# Patient Record
Sex: Female | Born: 1940 | Race: White | Hispanic: No | Marital: Married | Smoking: Former smoker
Health system: Southern US, Community
[De-identification: ages and names within clinical notes are randomized; demographics above are authoritative.]

## PROBLEM LIST (undated history)

## (undated) DIAGNOSIS — J9383 Other pneumothorax: Secondary | ICD-10-CM

## (undated) DIAGNOSIS — I10 Essential (primary) hypertension: Secondary | ICD-10-CM

## (undated) DIAGNOSIS — J449 Chronic obstructive pulmonary disease, unspecified: Secondary | ICD-10-CM

## (undated) DIAGNOSIS — I252 Old myocardial infarction: Secondary | ICD-10-CM

## (undated) HISTORY — PX: CHEST TUBE INSERTION: SHX231

---

## 2004-11-06 HISTORY — PX: OTHER SURGICAL HISTORY: SHX169

## 2016-08-10 ENCOUNTER — Encounter: Payer: Self-pay | Admitting: Emergency Medicine

## 2016-08-10 ENCOUNTER — Inpatient Hospital Stay
Admission: EM | Admit: 2016-08-10 | Discharge: 2016-08-16 | DRG: 201 | Disposition: A | Discharge status: Home / Self Care | Payer: Medicare PPO | Attending: Cardiothoracic Surgery | Admitting: Cardiothoracic Surgery

## 2016-08-10 ENCOUNTER — Emergency Department: Payer: Medicare PPO

## 2016-08-10 DIAGNOSIS — I10 Essential (primary) hypertension: Secondary | ICD-10-CM | POA: Diagnosis present

## 2016-08-10 DIAGNOSIS — I2510 Atherosclerotic heart disease of native coronary artery without angina pectoris: Secondary | ICD-10-CM | POA: Diagnosis present

## 2016-08-10 DIAGNOSIS — J9311 Primary spontaneous pneumothorax: Secondary | ICD-10-CM

## 2016-08-10 DIAGNOSIS — J9383 Other pneumothorax: Secondary | ICD-10-CM | POA: Diagnosis present

## 2016-08-10 DIAGNOSIS — I252 Old myocardial infarction: Secondary | ICD-10-CM | POA: Diagnosis not present

## 2016-08-10 DIAGNOSIS — Z09 Encounter for follow-up examination after completed treatment for conditions other than malignant neoplasm: Secondary | ICD-10-CM

## 2016-08-10 DIAGNOSIS — Z7982 Long term (current) use of aspirin: Secondary | ICD-10-CM | POA: Diagnosis not present

## 2016-08-10 DIAGNOSIS — Z79899 Other long term (current) drug therapy: Secondary | ICD-10-CM

## 2016-08-10 DIAGNOSIS — Z87891 Personal history of nicotine dependence: Secondary | ICD-10-CM

## 2016-08-10 DIAGNOSIS — Z9889 Other specified postprocedural states: Secondary | ICD-10-CM

## 2016-08-10 DIAGNOSIS — R0602 Shortness of breath: Secondary | ICD-10-CM | POA: Diagnosis present

## 2016-08-10 DIAGNOSIS — J939 Pneumothorax, unspecified: Secondary | ICD-10-CM | POA: Diagnosis present

## 2016-08-10 HISTORY — DX: Other pneumothorax: J93.83

## 2016-08-10 HISTORY — DX: Old myocardial infarction: I25.2

## 2016-08-10 LAB — COMPREHENSIVE METABOLIC PANEL
ALK PHOS: 53 U/L (ref 38–126)
ALT: 15 U/L (ref 14–54)
ANION GAP: 6 (ref 5–15)
AST: 27 U/L (ref 15–41)
Albumin: 3.9 g/dL (ref 3.5–5.0)
BILIRUBIN TOTAL: 0.7 mg/dL (ref 0.3–1.2)
BUN: 16 mg/dL (ref 6–20)
CALCIUM: 9.5 mg/dL (ref 8.9–10.3)
CO2: 32 mmol/L (ref 22–32)
Chloride: 101 mmol/L (ref 101–111)
Creatinine, Ser: 0.71 mg/dL (ref 0.44–1.00)
GFR calc non Af Amer: >60 mL/min (ref >60)
Glucose, Bld: 101 mg/dL — ABNORMAL HIGH (ref 65–99)
Potassium: 3.8 mmol/L (ref 3.5–5.1)
SODIUM: 139 mmol/L (ref 135–145)
TOTAL PROTEIN: 6.9 g/dL (ref 6.5–8.1)

## 2016-08-10 LAB — CBC WITH DIFFERENTIAL/PLATELET
Basophils Absolute: 0 10*3/uL (ref 0–0.1)
Basophils Relative: 0 %
EOS ABS: 0.1 10*3/uL (ref 0–0.7)
Eosinophils Relative: 1 %
HEMATOCRIT: 39.9 % (ref 35.0–47.0)
HEMOGLOBIN: 13.4 g/dL (ref 12.0–16.0)
LYMPHS ABS: 0.8 10*3/uL — AB (ref 1.0–3.6)
Lymphocytes Relative: 17 %
MCH: 32.1 pg (ref 26.0–34.0)
MCHC: 33.6 g/dL (ref 32.0–36.0)
MCV: 95.6 fL (ref 80.0–100.0)
MONO ABS: 0.3 10*3/uL (ref 0.2–0.9)
MONOS PCT: 7 %
NEUTROS PCT: 75 %
Neutro Abs: 3.6 10*3/uL (ref 1.4–6.5)
Platelets: 195 10*3/uL (ref 150–440)
RBC: 4.17 MIL/uL (ref 3.80–5.20)
RDW: 13.1 % (ref 11.5–14.5)
WBC: 4.9 10*3/uL (ref 3.6–11.0)

## 2016-08-10 LAB — APTT: APTT: 36 s (ref 24–36)

## 2016-08-10 LAB — PROTIME-INR
INR: 1
Prothrombin Time: 13.2 seconds (ref 11.4–15.2)

## 2016-08-10 MED ORDER — LISINOPRIL 20 MG PO TABS
40.0000 mg | ORAL_TABLET | Freq: Every day | ORAL | Status: DC
  Administered 2016-08-11 – 2016-08-16 (×6): 40 mg via ORAL
  Filled 2016-08-10 (×6): qty 2

## 2016-08-10 MED ORDER — METOPROLOL TARTRATE 50 MG PO TABS
100.0000 mg | ORAL_TABLET | Freq: Every day | ORAL | Status: DC
  Administered 2016-08-11 – 2016-08-16 (×5): 100 mg via ORAL
  Filled 2016-08-10 (×5): qty 2

## 2016-08-10 MED ORDER — DEXTROSE-NACL 5-0.45 % IV SOLN
INTRAVENOUS | Status: DC
  Administered 2016-08-10: 18:00:00 via INTRAVENOUS

## 2016-08-10 MED ORDER — LISINOPRIL 20 MG PO TABS: 20.0000 mg | ORAL_TABLET | Freq: Every day | ORAL | Status: DC

## 2016-08-10 MED ORDER — LIDOCAINE HCL (PF) 1 % IJ SOLN
INTRAMUSCULAR | Status: AC
  Filled 2016-08-10: qty 20

## 2016-08-10 MED ORDER — ATORVASTATIN CALCIUM 20 MG PO TABS
40.0000 mg | ORAL_TABLET | Freq: Every day | ORAL | Status: DC
  Administered 2016-08-11 – 2016-08-16 (×6): 40 mg via ORAL
  Filled 2016-08-10 (×6): qty 2

## 2016-08-10 MED ORDER — LISINOPRIL 20 MG PO TABS
ORAL_TABLET | ORAL | Status: AC
  Filled 2016-08-10: qty 1

## 2016-08-10 MED ORDER — HYDROCHLOROTHIAZIDE 12.5 MG PO CAPS
12.5000 mg | ORAL_CAPSULE | Freq: Every day | ORAL | Status: DC
  Administered 2016-08-11: 12.5 mg via ORAL
  Filled 2016-08-10: qty 1

## 2016-08-10 MED ORDER — ACETAMINOPHEN 325 MG PO TABS
650.0000 mg | ORAL_TABLET | ORAL | Status: DC | PRN
  Administered 2016-08-13 – 2016-08-15 (×5): 650 mg via ORAL
  Filled 2016-08-10 (×5): qty 2

## 2016-08-10 MED ORDER — HYDROCHLOROTHIAZIDE 12.5 MG PO CAPS
ORAL_CAPSULE | ORAL | Status: AC
  Filled 2016-08-10: qty 1

## 2016-08-10 MED ORDER — OXYCODONE-ACETAMINOPHEN 5-325 MG PO TABS
1.0000 | ORAL_TABLET | ORAL | Status: DC | PRN
  Administered 2016-08-14: 1 via ORAL
  Filled 2016-08-10: qty 1

## 2016-08-10 MED ORDER — LIDOCAINE HCL (PF) 1 % IJ SOLN
INTRAMUSCULAR | Status: AC
  Filled 2016-08-10: qty 10

## 2016-08-10 MED ORDER — ASPIRIN 81 MG PO CHEW
81.0000 mg | CHEWABLE_TABLET | Freq: Every day | ORAL | Status: DC
  Administered 2016-08-11 – 2016-08-16 (×6): 81 mg via ORAL
  Filled 2016-08-10 (×6): qty 1

## 2016-08-10 NOTE — Progress Notes (Signed)
Patient ID: Erica Cobb, female   DOB: Dec 08, 1940, 75 y.o.   MRN: [id number]  Chief Complaint  Patient presents with  . Shortness of Breath    HPI Erica Cobb is a 75 y.o. female.  She was vacationing at [facility] approximately 1 week ago when she experienced the acute onset of right-sided chest pain with shortness of breath. Over the course of several hours this progressed and she ultimately went to a urgent care center where chest x-ray showed a right-sided pneumothorax. A right-sided chest tube was placed and she was admitted to the hospital. Her chest tube remained in place for several days and was ultimately removed. She spent one additional evening in the hospital and a chest x-ray the next morning confirmed that the lung was fully inflated and she was discharged to home. That was approximately 5 days ago. Earlier today she experienced the acute onset of some right-sided chest pain with shortness of breath and felt very similar to the prior episode. She presented to our emergency department where a chest x-ray showed a large right-sided pneumothorax. I was asked to see the patient for evaluation and after review of her x-rays we placed a right-sided chest tube and will now admit the patient to the hospital for continued management. She states that she does feel better after the chest tube is been inserted. She denies any significant pain at the chest tube site. She is hungry. She is inspiring nasal cannula oxygen but does not feel short of breath at this time. She does have a prior history of smoking but quit 11 years ago. She also has a history of coronary artery disease for which she takes aspirin. She had several stents placed 11 years ago. She follows with Dr. Harold Hedge for management of her coronary disease and her hypertension.   Past Medical History:  Diagnosis Date  . 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 Spontaneous pneumothorax     Past Surgical History:  Procedure Laterality Date  . CHEST  TUBE INSERTION     for spontaneous pneumo    History reviewed. No pertinent family history.  Social History Social History  Substance Use Topics  . Smoking status: Former Games developer  . Smokeless tobacco: Never Used  . Alcohol use No     Comment: socially    No Known Allergies  Current Facility-Administered Medications  Medication Dose Route Frequency Provider Last Rate Last Dose  . acetaminophen (TYLENOL) tablet 650 mg  650 mg Oral Q4H PRN Hulda Marin, MD      . aspirin chewable tablet 81 mg  81 mg Oral Daily Hulda Marin, MD      . Melene Muller ON 08/11/2016] atorvastatin (LIPITOR) tablet 40 mg  40 mg Oral Q0600 Hulda Marin, MD      . dextrose 5 %-0.45 % sodium chloride infusion   Intravenous Continuous Hulda Marin, MD      . hydrochlorothiazide (MICROZIDE) capsule 12.5 mg  12.5 mg Oral Daily Hulda Marin, MD      . lidocaine (PF) (XYLOCAINE) 1 % injection           . lisinopril (PRINIVIL,ZESTRIL) tablet 20 mg  20 mg Oral Daily Hulda Marin, MD      . Melene Muller ON 08/11/2016] metoprolol (LOPRESSOR) tablet 100 mg  100 mg Oral Q0600 Hulda Marin, MD      . oxyCODONE-acetaminophen (PERCOCET/ROXICET) 5-325 MG per tablet 1-2 tablet  1-2 tablet Oral Q4H PRN Hulda Marin, MD  Current Outpatient Prescriptions  Medication Sig Dispense Refill  . aspirin EC 81 MG tablet Take 1 tablet by mouth daily.    Marland Kitchen. atorvastatin (LIPITOR) 40 MG tablet Take 1 tablet by mouth daily.    Marland Kitchen. lisinopril-hydrochlorothiazide (PRINZIDE,ZESTORETIC) 20-12.5 MG tablet Take 1 tablet by mouth daily.    . metoprolol succinate (TOPROL-XL) 100 MG 24 hr tablet Take 1 tablet by mouth daily.      Location, Quality, Duration, Severity, Timing, Context, Modifying Factors, Associated Signs and Symptoms.  Review of Systems A complete review of systems was asked and was negative except for the following positive findings shortness of breath and chest pain  Blood pressure (!) 154/109, pulse 94, temperature 97.6 F (36.4 C),  temperature source Oral, resp. rate (!) 28, height 5' (1.524 m), weight 93 lb (42.2 kg), SpO2 99 %.  Physical Exam CONSTITUTIONAL:  Pleasant, well-developed, well-nourished, and in no acute distress. EYES: Pupils equal and reactive to light, Sclera non-icteric EARS, NOSE, MOUTH AND THROAT:  The oropharynx was clear.  Dentition is good repair.  Oral mucosa pink and moist. LYMPH NODES:  Lymph nodes in the neck and axillae were normal RESPIRATORY:  Lungs were clear on the left but almost absent on the right..  Normal respiratory effort without pathologic use of accessory muscles of respiration CARDIOVASCULAR: Heart was regular without murmurs.  There were no carotid bruits. GI: The abdomen was soft, nontender, and nondistended. There were no palpable masses. There was no hepatosplenomegaly. There were normal bowel sounds in all quadrants. GU:   MUSCULOSKELETAL:  Normal muscle strength and tone.  No clubbing or cyanosis.   SKIN:  There were no pathologic skin lesions.  There were no nodules on palpation.  There was a prior chest tube site with a small amount of cutaneous swelling. The sutures were removed. There is no purulence or redness. NEUROLOGIC:  Sensation is normal.  Cranial nerves are grossly intact. PSYCH:  Oriented to person, place and time.  Mood and affect are normal.  Data Reviewed I have independently reviewed the patient's chest x-ray pre-and post chest tube insertion. After insertion of the chest in the lung is now completely expanded.  I have personally reviewed the patient's imaging and medical records.    Assessment    Spontaneous pneumothorax right side    Plan    We will admit the patient to the hospital. Will manage her chest tube expectantly. We will obtain a CT scan the chest. We will ask Dr. Garwin BrothersKenneth Pfaff to see the patient regarding her hypertension.  I explained to the patient that talc is currently on back order and is no longer available. This does make our  options somewhat limited. Will manage the tube expectantly for the time being.       Hulda Marinimothy Ladarryl Wrage, MD 08/10/2016, 5:08 PM

## 2016-08-10 NOTE — ED Provider Notes (Signed)
Time Seen: Approximately *0143 pm  I have reviewed the triage notes  Chief Complaint: Shortness of Breath   History of Present Illness: Erica Cobb is a 75 y.o. female who was recently discharged from a [facility] for a pneumothorax. Patient was told that she may have had a blood that ruptured and she had a chest tube in for 3 days. The chest tube was removed on Friday and she was observed and discharged on Sunday asymptomatic and resolution. The patient states she has sudden onset of shortness of breath today with a very similar feeling that she had with her previous pneumothorax. She denies any trouble with speech or swallowing.   Past Medical History:  Diagnosis Date  . 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 Spontaneous pneumothorax     There are no active problems to display for this patient.   Past Surgical History:  Procedure Laterality Date  . CHEST TUBE INSERTION     for spontaneous pneumo    Past Surgical History:  Procedure Laterality Date  . CHEST TUBE INSERTION     for spontaneous pneumo      Allergies:  Review of patient's allergies indicates no known allergies.  Family History: History reviewed. No pertinent family history.  Social History: Social History  Substance Use Topics  . Smoking status: Former Games developer  . Smokeless tobacco: Never Used  . Alcohol use No     Comment: socially     Review of Systems:   10 point review of systems was performed and was otherwise negative:  Constitutional: No fever Eyes: No visual disturbances ENT: No sore throat, ear pain Cardiac: Mild right-sided chest discomfort Respiratory: Shortness of breath with no wheezing or stridor Abdomen: No abdominal pain, no vomiting, No diarrhea Endocrine: No weight loss, No night sweats Extremities: No peripheral edema, cyanosis Skin: No rashes, easy bruising Neurologic: No focal weakness, trouble with speech or swollowing Urologic: No dysuria, Hematuria, or urinary  frequency *  Physical Exam:  ED Triage Vitals  Enc Vitals Group     BP 08/10/16 1321 (!) 180/112     Pulse Rate 08/10/16 1321 94     Resp 08/10/16 1321 (!) 26     Temp 08/10/16 1323 97.6 F (36.4 C)     Temp Source 08/10/16 1323 Oral     SpO2 08/10/16 1321 97 %     Weight 08/10/16 1308 93 lb (42.2 kg)     Height 08/10/16 1308 5' (1.524 m)     Head Circumference --      Peak Flow --      Pain Score --      Pain Loc --      Pain Edu? --      Excl. in GC? --     General: Awake , Alert , and Oriented times 3; GCS 15 Head: Normal cephalic , atraumatic Eyes: Pupils equal , round, reactive to light Nose/Throat: No nasal drainage, patent upper airway without erythema or exudate.  Neck: Supple, Full range of motion, No anterior adenopathy or palpable thyroid masses Lungs: Diminished breath sounds at the right base without any rales or rhonchi noted. Trachea is midline Heart: Regular rate, regular rhythm without murmurs , gallops , or rubs Abdomen: Soft, non tender without rebound, guarding , or rigidity; bowel sounds positive and symmetric in all 4 quadrants. No organomegaly .        Extremities: 2 plus symmetric pulses. No edema, clubbing or cyanosis  Neurologic: normal ambulation, Motor symmetric without deficits, sensory intact Skin: warm, dry, no rashes   Labs:   All laboratory work was reviewed including any pertinent negatives or positives listed below:  Labs Reviewed  CBC WITH DIFFERENTIAL/PLATELET - Abnormal; Notable for the following:       Result Value   Lymphs Abs 0.8 (*)    All other components within normal limits  COMPREHENSIVE METABOLIC PANEL - Abnormal; Notable for the following:    Glucose, Bld 101 (*)    All other components within normal limits  PROTIME-INR  APTT  Most laboratory work is still pending at disposition  Radiology:  "Dg Chest 2 View  Result Date: 08/10/2016 CLINICAL DATA:  Shortness of breath.  Recent collapsed lung. EXAM: CHEST  2 VIEW  COMPARISON:  None. FINDINGS: Approximately 70% right pneumothorax. Mild mediastinal shift to the left. Tortuous and calcified thoracic aorta. The left lung is mildly hyperexpanded with mildly prominent interstitial markings. Mild scoliosis. Diffuse osteopenia. Coronary artery stent. IMPRESSION: 1. Approximately 70% right pneumothorax, probably under mild tension. 2. Mild changes of COPD. 3. Aortic atherosclerosis. Critical Value/emergent results were called by telephone at the time of interpretation on 08/10/2016 at 2:25 pm to Dr. Lacretia NicksBRIAN Aviraj Kentner , who verbally acknowledged these results. Electronically Signed   By: Beckie SaltsSteven  Reid M.D.   On: 08/10/2016 14:27  "  I personally reviewed the radiologic studies    ED Course: * Patient's stay here was uneventful and she was placed on supplemental oxygen. She has stable blood pressure, etc. no felt was not symptomatic from a tension pneumothorax at this time. Patient's case was reviewed with general surgery and will be seen by cardiothoracic surgery.   Clinical Course     Assessment:  Large right-sided spontaneous pneumothorax  Final Clinical Impression:   Final diagnoses:  Primary spontaneous pneumothorax     Plan: * Inpatient            Jennye MoccasinBrian S Xayne Brumbaugh, MD 08/10/16 1529

## 2016-08-10 NOTE — ED Triage Notes (Signed)
[place]  Chest tube was removed and pt came home on Saturday.  Became sob today with decreased breath sounds on the right (same side as previous chest tube). Pt states feels better on 4 liters but still has labored breathing

## 2016-08-10 NOTE — ED Notes (Signed)
Pt refused BP medication; states she has already taken her daily BP meds and that her BP will come down on its own as she calms down.

## 2016-08-10 NOTE — Consult Note (Signed)
[facility] CLINIC CARDIOLOGY [facility]  CARDIOLOGY CONSULT NOTE  Patient ID: Erica Cobb MRN: [id number] DOB/AGE: 11-16-1940 75 y.o.  Admit date: 08/10/2016 Referring Physician Dr. Thelma Barge Primary Physician   Primary Cardiologist Dr. Lady Gary Reason for Consultation BP management  HPI: Pt is a 75 yo female with history of remote mi s/p pci who was admitted after suffering a recurrent spontaneous PTX. She has history of hypertension and was relatively hypertensive on admission . She states she is compliant with her home meds. Which include lisinopril/hctz 20/12.5, metoprolol 100 daily. SBP was 170 on admission. She is currenlty with some mild discomfort at chest tube site but denies chest pain or sob.   Review of Systems  Constitutional: Negative.   HENT: Negative.   Eyes: Negative.   Respiratory: Negative.   Cardiovascular: Negative.   Gastrointestinal: Negative.   Genitourinary: Negative.   Musculoskeletal: Negative.   Skin: Negative.   Neurological: Negative.   Endo/Heme/Allergies: Negative.   Psychiatric/Behavioral: Negative.     Past Medical History:  Diagnosis Date  . 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 Spontaneous pneumothorax     History reviewed. No pertinent family history.  Social History   Social History  . Marital status: Married    Spouse name: N/A  . Number of children: N/A  . Years of education: N/A   Occupational History  . Not on file.   Social History Main Topics  . Smoking status: Former Games developer  . Smokeless tobacco: Never Used  . Alcohol use No     Comment: socially  . Drug use: No  . Sexual activity: Not on file   Other Topics Concern  . Not on file   Social History Narrative  . No narrative on file    Past Surgical History:  Procedure Laterality Date  . CHEST TUBE INSERTION     for spontaneous pneumo     Prescriptions Prior to Admission  Medication Sig Dispense Refill Last Dose  . aspirin EC 81 MG tablet Take 1 tablet by mouth daily.    08/10/2016 at 0830  . atorvastatin (LIPITOR) 40 MG tablet Take 1 tablet by mouth daily.   08/10/2016 at 0830  . lisinopril-hydrochlorothiazide (PRINZIDE,ZESTORETIC) 20-12.5 MG tablet Take 1 tablet by mouth daily.   08/10/2016 at 0830  . metoprolol succinate (TOPROL-XL) 100 MG 24 hr tablet Take 1 tablet by mouth daily.   08/10/2016 at 0830    Physical Exam: Blood pressure (!) 185/86, pulse 73, temperature 97.9 F (36.6 C), temperature source Oral, resp. rate 17, height 5' (1.524 m), weight 42.2 kg (93 lb), SpO2 100 %.   Wt Readings from Last 1 Encounters:  08/10/16 42.2 kg (93 lb)     General appearance: alert and cooperative Resp: clear to auscultation bilaterally Cardio: regular rate and rhythm GI: soft, non-tender; bowel sounds normal; no masses,  no organomegaly Extremities: extremities normal, atraumatic, no cyanosis or edema Neurologic: Grossly normal  Labs:   Lab Results  Component Value Date   WBC 4.9 08/10/2016   HGB 13.4 08/10/2016   HCT 39.9 08/10/2016   MCV 95.6 08/10/2016   PLT 195 08/10/2016    Recent Labs Lab 08/10/16 1339  NA 139  K 3.8  CL 101  CO2 32  BUN 16  CREATININE 0.71  CALCIUM 9.5  PROT 6.9  BILITOT 0.7  ALKPHOS 53  ALT 15  AST 27  GLUCOSE 101*   No results found for: CKTOTAL, CKMB, CKMBINDEX, TROPONINI  Radiology: Right ptx with re expansion s/p chest tube EKG:   ASSESSMENT AND PLAN:  Pt with history of hypertension and remote cad. S/p spontaneous ptx now with chest tube. Hypertensive on admission. Will increase lisinopril to 40 and follow Continue with hctz and metoprolol. Will follow.  Signed: Dalia HeadingFATH,Oyuki Hogan A. MD, [facility] 08/10/2016, 10:44 PM

## 2016-08-10 NOTE — Progress Notes (Signed)
Patient BP 170/92 and states that she took her meds this morning and refuses blood pressure meds this evening Dr. Thelma Bargeaks made aware and patient does have a Cardiology consult that was called this evening. Patient denies pain at this time.

## 2016-08-10 NOTE — Progress Notes (Signed)
    5:16 PM  PATIENT:  Erica Cobb  75 y.o. female  PRE-OPERATIVE DIAGNOSIS:  Spontaneous right-sided pneumothorax  POST-OPERATIVE DIAGNOSIS:  Same  PROCEDURE:  Insertion of right sided chest tube  SURGEON:  Hulda Marinimothy Nillie Bartolotta M.D.  ASSISTANTS: None  ANESTHESIA: Local  INDICATIONS FOR PROCEDURE tension pneumothorax right side  DICTATION: After reviewing the chest x-ray showing a tension pneumothorax right side the patient was apprised of the indications and risk of chest tube insertion. Risks were reviewed. The patient gave her informed consent.  The patient was prepped and draped in usual sterile fashion. Using lidocaine as local anesthetic a small skin wheal was raised and the pleural space was accessed with a hollow bore needle. A wire was placed through the needle into the pleural space. Additional lidocaine was used to anesthetize the subcutaneous tract. A small skin incision was made along the wire and a 14 French pigtail catheter was inserted over the wire into the pleural space. It was then secured to the chest with #1 silk. The tube was secured.  Sterile dressings were applied. Post procedure chest x-ray showed the lung to be fully inflated. The patient tolerated procedure well.   Hulda Marinimothy Lulla Linville, MD

## 2016-08-11 ENCOUNTER — Inpatient Hospital Stay: Payer: Medicare PPO

## 2016-08-11 MED ORDER — IOPAMIDOL (ISOVUE-300) INJECTION 61%
75.0000 mL | Freq: Once | INTRAVENOUS | Status: AC | PRN
  Administered 2016-08-11: 75 mL via INTRAVENOUS

## 2016-08-11 NOTE — Progress Notes (Signed)
Patient with second spontenaous ptx on right.  Chest tube in place.  She is using IS.  She states pain well controlled.    Vitals:   08/11/16 0449 08/11/16 0831  BP: (!) 167/94 (!) 149/91  Pulse: 68 (!) 56  Resp: 18 17  Temp: 98 F (36.7 C) 97.7 F (36.5 C)   PE:  Gen: NAD Res: CTAB/L< chest tube site clean with tube in place, no air leak to inspiration or cough   CXR and CT scan with lung inflated  A/P:  Will leave CT to suction overnight, likely will take off of suction tomorrow and then get f/u CXR after 6 hours.

## 2016-08-11 NOTE — Care Management Important Message (Signed)
Important Message  Patient Details  Name: Erica Cobb MRN: [id number] Date of Birth: 1941/07/18   Medicare Important Message Given:       Chapman FitchBOWEN, Halyn Flaugher T, RN 08/11/2016, 3:13 PM

## 2016-08-12 ENCOUNTER — Inpatient Hospital Stay: Payer: Medicare PPO

## 2016-08-12 MED ORDER — AMLODIPINE BESYLATE 5 MG PO TABS
5.0000 mg | ORAL_TABLET | Freq: Every day | ORAL | Status: DC
  Administered 2016-08-12 – 2016-08-16 (×5): 5 mg via ORAL
  Filled 2016-08-12 (×5): qty 1

## 2016-08-12 MED ORDER — HYDROCHLOROTHIAZIDE 25 MG PO TABS
25.0000 mg | ORAL_TABLET | Freq: Every day | ORAL | Status: DC
  Administered 2016-08-12 – 2016-08-16 (×5): 25 mg via ORAL
  Filled 2016-08-12 (×5): qty 1

## 2016-08-12 NOTE — Progress Notes (Signed)
Patient with second spontenaous ptx on right.  Chest tube in place.  She is using IS.  She states pain well controlled without needing any medicine.   Vitals:   08/11/16 2124 08/12/16 0517  BP: (!) 161/82 (!) 155/87  Pulse: 81 72  Resp: 20 16  Temp: 97.4 F (36.3 C) 97.8 F (36.6 C)   PE:  Gen: NAD Res: CTAB/L, chest tube site clean with tube in place, no air leak to inspiration or cough   A/P:  Chest tube placed off suction to water seal, will get CXR in 6 hours to make sure lung still inflated.  Continue IS and may ambulate

## 2016-08-12 NOTE — Progress Notes (Signed)
[facility]  SUBJECTIVE: No complaints. Blood pressure still somewhat elevated.    Vitals:   08/11/16 0831 08/11/16 2124 08/12/16 0517 08/12/16 1353  BP: (!) 149/91 (!) 161/82 (!) 155/87 (!) 166/81  Pulse: (!) 56 81 72 70  Resp: 17 20 16 16   Temp: 97.7 F (36.5 C) 97.4 F (36.3 C) 97.8 F (36.6 C) 97.9 F (36.6 C)  TempSrc: Oral Oral Oral Oral  SpO2: 98% 100% 94% 98%  Weight:      Height:        Intake/Output Summary (Last 24 hours) at 08/12/16 1557 Last data filed at 08/12/16 1515  Gross per 24 hour  Intake              245 ml  Output             1650 ml  Net            -1405 ml    LABS: Basic Metabolic Panel:  Recent Labs  16/08/9609/05/17 1339  NA 139  K 3.8  CL 101  CO2 32  GLUCOSE 101*  BUN 16  CREATININE 0.71  CALCIUM 9.5   Liver Function Tests:  Recent Labs  08/10/16 1339  AST 27  ALT 15  ALKPHOS 53  BILITOT 0.7  PROT 6.9  ALBUMIN 3.9   No results for input(s): LIPASE, AMYLASE in the last 72 hours. CBC:  Recent Labs  08/10/16 1339  WBC 4.9  NEUTROABS 3.6  HGB 13.4  HCT 39.9  MCV 95.6  PLT 195   Cardiac Enzymes: No results for input(s): CKTOTAL, CKMB, CKMBINDEX, TROPONINI in the last 72 hours. BNP: Invalid input(s): POCBNP D-Dimer: No results for input(s): DDIMER in the last 72 hours. Hemoglobin A1C: No results for input(s): HGBA1C in the last 72 hours. Fasting Lipid Panel: No results for input(s): CHOL, HDL, LDLCALC, TRIG, CHOLHDL, LDLDIRECT in the last 72 hours. Thyroid Function Tests: No results for input(s): TSH, T4TOTAL, T3FREE, THYROIDAB in the last 72 hours.  Invalid input(s): FREET3 Anemia Panel: No results for input(s): VITAMINB12, FOLATE, FERRITIN, TIBC, IRON, RETICCTPCT in the last 72 hours.   Physical Exam: Blood pressure (!) 166/81, pulse 70, temperature 97.9 F (36.6 C), temperature source Oral, resp. rate 16, height 5' (1.524 m), weight 42.2 kg (93 lb), SpO2 98 %.   Wt Readings  from Last 1 Encounters:  08/10/16 42.2 kg (93 lb)     General appearance: alert and cooperative Resp: rubs RLL Cardio: regular rate and rhythm GI: soft, non-tender; bowel sounds normal; no masses,  no organomegaly Extremities: extremities normal, atraumatic, no cyanosis or edema Neurologic: Grossly normal  TELEMETRY: Reviewed telemetry pt in nsr:  ASSESSMENT AND PLAN:  Active Problems:   Pneumothorax-chest tube still in place off suction HTN-still not well controlled. Will add amlodipine and increase hctz to 25 mg daily    Dalia HeadingFATH,Quintina Hakeem A., MD, [facility] 08/12/2016 3:57 PM

## 2016-08-13 NOTE — Care Management Important Message (Signed)
Important Message  Patient Details  Name: Erica Cobb MRN: [id number] Date of Birth: 02-11-1941   Medicare Important Message Given:  Yes    Arless Vineyard A, RN 08/13/2016, 2:46 PM

## 2016-08-13 NOTE — Progress Notes (Signed)
Patient with second spontenaous ptx on right.  Chest tube in place.  She is using IS but only getting to about 700, we did practice this AM and she will continue today.  She states pain well controlled without needing any medicine.   Vitals:   08/13/16 0455 08/13/16 0958  BP: (!) 152/108 135/78  Pulse: 73   Resp: 19   Temp: 97.7 F (36.5 C)    PE:  Gen: NAD Res: CTAB/L, chest tube site clean with tube in place, no air leak to inspiration or cough   A/P:  Chest tube  to water seal, on CXR lung appears to be inflated without any residual ptx, no air leak on chest tube,   Continue IS and may ambulate.  Dr. Thelma Bargeaks will take back over her care tomorrow AM and determine if chest tube can be removed or if a possible blebectomy is needed.  Patient and family understand and are in agreement with the plan.

## 2016-08-14 MED ORDER — MORPHINE SULFATE (PF) 2 MG/ML IV SOLN
2.0000 mg | INTRAVENOUS | Status: DC | PRN
  Administered 2016-08-14: 2 mg via INTRAVENOUS
  Filled 2016-08-14: qty 1

## 2016-08-14 MED ORDER — ONDANSETRON HCL 4 MG/2ML IJ SOLN: 4.0000 mg | Freq: Four times a day (QID) | INTRAMUSCULAR | Status: DC

## 2016-08-14 MED ORDER — SODIUM CHLORIDE 0.9 % IV SOLN
500.0000 mg | Freq: Once | INTRAVENOUS | Status: AC
  Administered 2016-08-14: 500 mg via INTRAPLEURAL
  Filled 2016-08-14: qty 500

## 2016-08-14 MED ORDER — ONDANSETRON HCL 4 MG/2ML IJ SOLN
4.0000 mg | Freq: Four times a day (QID) | INTRAMUSCULAR | Status: DC | PRN
Start: 2016-08-14 — End: 2016-08-16

## 2016-08-14 NOTE — Progress Notes (Signed)
Chaplain saw patient on Saturday when she was doing her physical exercises around the unit and promise her daughters to visit her. This morning chaplain was able to fulfill the promise and the patient recognized the chaplain and told him that she saw him on Saturday. Pt was with her husband and daughter at the time of this visit and she told chaplain that she was making steady progress towards recovery. Patient asked for prayers and spiritual support which the chaplain provided.   08/14/16 1400  Clinical Encounter Type  Visited With Patient  Visit Type Initial;Follow-up;Spiritual support  Spiritual Encounters  Spiritual Needs Prayer

## 2016-08-14 NOTE — Progress Notes (Signed)
  Patient ID: Erica FettersKaren Cobb, female   DOB: 1941-07-11, 75 y.o.   MRN: [id number]  HISTORY: She had a pretty uneventful weekend. She did have a CT scan performed. She is not short of breath. She has no specific complaints today.   Vitals:   08/14/16 0441 08/14/16 1238  BP: 115/76 114/74  Pulse: 88 78  Resp: 19 18  Temp: 98.2 F (36.8 C) 98.2 F (36.8 C)     EXAM:    Resp: Lungs are clear bilaterally.  No respiratory distress, normal effort. Heart:  Regular without murmurs Abd:  Abdomen is soft, non distended and non tender. No masses are palpable.  There is no rebound and no guarding.  Neurological: Alert and oriented to person, place, and time. Coordination normal.  Skin: Skin is warm and dry. No rash noted. No diaphoretic. No erythema. No pallor.  Psychiatric: Normal mood and affect. Normal behavior. Judgment and thought content normal.    ASSESSMENT: I did independently review the chest CT. 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 There is no definitive bulla or bleb that can be seen. There was a very rare bubble that was present with exertion. This could not be reproduced on a consistent basis.   PLAN:   I had a long discussion today with her family. I reviewed with them the CT scan. I discussed with the patient the options of performing a thoracoscopy with pleurectomy or pleural abrasion. I discussed with them the option a doxycycline pleurodesis. After extensive discussion with the patient and her husband they would like to proceed with a more definitive management rather than conservative care. For this reason we did perform a bedside doxycycline pleurodesis using 500 mg of doxycycline. The tube was suspended above the patient's bed the left waterseal. It was not clamped. He will remain so for the next 6 hours. He'll then be placed back to suction. She tolerated the pleurodesis reasonably well and was given intravenous morphine for  pain management.    Hulda Marinimothy Francois Elk, MD

## 2016-08-14 NOTE — Care Management (Signed)
Chest tube remains to water seal and intermittent suction for .   Dr Thelma Bargeoaks to resume patient's plan of care today.  will determine if chest tube can be removed.  Possible need for blebectomy- which will be determined by Dr Thelma Bargeaks

## 2016-08-15 ENCOUNTER — Inpatient Hospital Stay: Payer: Medicare PPO

## 2016-08-15 NOTE — Progress Notes (Signed)
Nutrition Brief Note  Patient identified with low BMI.  Wt Readings from Last 15 Encounters:  08/10/16 93 lb (42.2 kg)   Pt reports stable wt prior to admission and appetite only decreased for day or 2 prior to admission.  "I am not a very big eater." Breakfast tray observed this am.    Body mass index is 18.16 kg/m.   Current diet order is regular, patient is consuming approximately 50% of meals at this time. Labs and medications reviewed.   No nutrition interventions warranted at this time. If nutrition issues arise, please consult RD.   Tyreka Henneke B. Freida BusmanAllen, RD, LDN [phone] (pager) Weekend/On-Call pager [phone])

## 2016-08-15 NOTE — Progress Notes (Signed)
  Patient ID: Erica Cobb, female   DOB: 08/15/1941, 75 y.o.   MRN: [id number]  HISTORY: Did well overnight.  Pain under good control.  Minimal discomfort after the first few hours following doxycycline pleurodesis.  Walking and eating well.   Vitals:   08/14/16 2128 08/15/16 0544  BP: 127/76 105/68  Pulse: 79 93  Resp: 17 17  Temp: 98.5 F (36.9 C) 98.4 F (36.9 C)     EXAM:    Resp: Lungs are clear bilaterally.  No respiratory distress, normal effort. Heart:  Regular without murmurs Abd:  Abdomen is soft, non distended and non tender. No masses are palpable.  There is no rebound and no guarding.  Neurological: Alert and oriented to person, place, and time. Coordination normal.  Skin: Skin is warm and dry. No rash noted. No diaphoretic. No erythema. No pallor. There is bruising around old and new chest tube sites.  Redressed. Psychiatric: Normal mood and affect. Normal behavior. Judgment and thought content normal.   No air leak seen.  Independent review of CXRay from today shows minimal pleural effusion and very small apical pneumothorax.  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 Did well overnight and no air leak today    PLAN:   Will leave on water seal today and repeat CXray in the morning.  If OK, pull tube.    Hulda Marinimothy Jeray Shugart, MD

## 2016-08-16 ENCOUNTER — Other Ambulatory Visit: Payer: Self-pay | Admitting: Cardiothoracic Surgery

## 2016-08-16 ENCOUNTER — Inpatient Hospital Stay: Payer: Medicare PPO

## 2016-08-16 MED ORDER — AMLODIPINE BESYLATE 5 MG PO TABS: 5.0000 mg | ORAL_TABLET | Freq: Every day | ORAL | 0 refills | Status: DC

## 2016-08-16 NOTE — Progress Notes (Signed)
MD ordered patient to be discharged home.  Discharge instructions were reviewed with the patient and husband and  they voiced understanding.  Follow-up appointment was made.  Prescription sent to patient's pharmacy.  IV was removed with catheter intact.  All patients questions were answered.  Patient left via wheelchair escorted by auxillary.

## 2016-08-16 NOTE — Progress Notes (Signed)
  Patient ID: Erica Cobb, female   DOB: 12/10/1940, 75 y.o.   MRN: [id number]  HISTORY: No issues.  Not short of breath.  Walking and tolerating regular diet.   Vitals:   08/16/16 0422 08/16/16 1050  BP: 129/74 114/80  Pulse: 82 72  Resp: 18   Temp: 98 F (36.7 C)      EXAM:    Resp: Lungs are clear bilaterally.  No respiratory distress, normal effort. Heart:  Regular without murmurs Abd:  Abdomen is soft, non distended and non tender. No masses are palpable.  There is no rebound and no guarding.  Neurological: Alert and oriented to person, place, and time. Coordination normal.  Skin: Skin is warm and dry. No rash noted. No diaphoretic. No erythema. No pallor.  Psychiatric: Normal mood and affect. Normal behavior. Judgment and thought content normal.    ASSESSMENT: I have assessed the chest tube and there is no air leak.  I removed the tube and reviewed the CXRay after.  It looked fine without pneumothorax    PLAN:   I will see her back in one week.  I gave her discharge instructions.  She was counseled about a primary care provider.      Hulda Marinimothy Nollie Shiflett, MD

## 2016-08-16 NOTE — Discharge Summary (Signed)
Physician Discharge Summary  Patient ID: Erica Cobb MRN: [id number] DOB/AGE: [birth year] 76 y.o.  Admit date: 08/10/2016 Discharge date: 08/16/2016   Discharge Diagnoses:  Active Problems:   Pneumothorax   Procedures:Right chest tube an ddoxycycline pleurodesis  Hospital Course: Admitted with recurrent right pneumothorax.  Chest tube placed and underwent CT scan.  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  Doxycycline pleurodesis performed and chest tube removed after 2 days.  Post pull CXRay shows no pneumothroax.  Disposition: Final discharge disposition not confirmed  Discharge Instructions    Call MD for:  redness, tenderness, or signs of infection (pain, swelling, redness, odor or green/yellow discharge around incision site)    Complete by:  As directed    Diet - low sodium heart healthy    Complete by:  As directed    Increase activity slowly    Complete by:  As directed        Medication List    TAKE these medications   amLODipine 5 MG tablet Commonly known as:  NORVASC Take 1 tablet (5 mg total) by mouth daily. Start taking on:  08/17/2016   aspirin EC 81 MG tablet Take 1 tablet by mouth daily.   atorvastatin 40 MG tablet Commonly known as:  LIPITOR Take 1 tablet by mouth daily.   lisinopril-hydrochlorothiazide 20-12.5 MG tablet Commonly known as:  PRINZIDE,ZESTORETIC Take 1 tablet by mouth daily.   metoprolol succinate 100 MG 24 hr tablet Commonly known as:  TOPROL-XL Take 1 tablet by mouth daily.        [name], MD

## 2016-08-16 NOTE — Discharge Instructions (Signed)
Pneumothorax °A pneumothorax, commonly called a collapsed lung, is a condition in which air leaks from a lung and builds up in the space between the lung and the chest wall (pleural space). The air in a pneumothorax is trapped outside the lung and takes up space, preventing the lung from fully expanding. This is a condition that usually occurs suddenly. The buildup of air may be small or large. A small pneumothorax may go away on its own. When a pneumothorax is larger, it will often require medical treatment and hospitalization.  °CAUSES  °A pneumothorax can sometimes happen quickly with no apparent cause. People with underlying lung problems, particularly COPD or emphysema, are at higher risk of pneumothorax. However, pneumothorax can happen quickly even in people with no prior known lung problems. Trauma, surgery, medical procedures, or injury to the chest wall can also cause a pneumothorax. °SIGNS AND SYMPTOMS  °Sometimes a pneumothorax will have no symptoms. When symptoms are present, they can include: °· Chest pain. °· Shortness of breath. °· Increased rate of breathing. °· Bluish color to your lips or skin (cyanosis). °DIAGNOSIS  °Pneumothorax is usually diagnosed by a chest X-ray or chest CT scan. Your health care provider will also take a medical history and perform a physical exam to determine why you may have a pneumothorax. °TREATMENT  °A small pneumothorax may go away on its own without treatment. Extra oxygen can sometimes help a small pneumothorax go away more quickly. For a larger pneumothorax or a pneumothorax that is causing symptoms, a procedure is usually needed to drain the air. In some cases, the health care provider may drain the air using a needle. In other cases, a chest tube may be inserted into the pleural space. A chest tube is a small tube placed between the ribs and into the pleural space. This removes the extra air and allows the lung to expand back to its normal size. A large  pneumothorax will usually require a hospital stay. If there is ongoing air leakage into the pleural space, then the chest tube may need to remain in place for several days until the air leak has healed. In some cases, surgery may be needed.  °HOME CARE INSTRUCTIONS  °· Only take over-the-counter or prescription medicines as directed by your health care provider. °· If a cough or pain makes it difficult for you to sleep at night, try sleeping in a semi-upright position in a recliner or by using 2 or 3 pillows. °· Rest and limit activity as directed by your health care provider. °· If you had a chest tube and it was removed, ask your health care provider when it is okay to remove the dressing. Until your health care provider says you can remove the dressing, do not allow it to get wet. °· Do not smoke. Smoking is a risk factor for pneumothorax. °· Do not fly in an airplane or scuba dive until your health care provider says it is okay. °· Follow up with your health care provider as directed. °SEEK IMMEDIATE MEDICAL CARE IF:  °· You have increasing chest pain or shortness of breath. °· You have a cough that is not controlled with suppressants. °· You begin coughing up blood. °· You have pain that is getting worse or is not controlled with medicines. °· You cough up thick, discolored mucus (sputum) that is yellow to green in color. °· You have redness, increasing pain, or discharge at the site where a chest tube had been in place (if   your pneumothorax was treated with a chest tube). °· The site where your chest tube was located opens up. °· You feel air coming out of the site where the chest tube was placed. °· You have a fever or persistent symptoms for more than 2-3 days. °· You have a fever and your symptoms suddenly get worse. °MAKE SURE YOU:  °· Understand these instructions. °· Will watch your condition. °· Will get help right away if you are not doing well or get worse. °  °This information is not intended to  replace advice given to you by your health care provider. Make sure you discuss any questions you have with your health care provider. °  °Document Released: 10/23/2005 Document Revised: 08/13/2013 Document Reviewed: 05/22/2013 °Elsevier Interactive Patient Education ©2016 [employer] ° °

## 2016-08-17 ENCOUNTER — Other Ambulatory Visit: Payer: Self-pay

## 2016-08-17 DIAGNOSIS — Z9889 Other specified postprocedural states: Secondary | ICD-10-CM

## 2016-08-24 DIAGNOSIS — Z720 Tobacco use: Secondary | ICD-10-CM | POA: Insufficient documentation

## 2016-08-24 DIAGNOSIS — E785 Hyperlipidemia, unspecified: Secondary | ICD-10-CM | POA: Insufficient documentation

## 2016-08-24 DIAGNOSIS — I2510 Atherosclerotic heart disease of native coronary artery without angina pectoris: Secondary | ICD-10-CM | POA: Insufficient documentation

## 2016-08-24 DIAGNOSIS — I10 Essential (primary) hypertension: Secondary | ICD-10-CM | POA: Insufficient documentation

## 2016-08-25 ENCOUNTER — Encounter: Payer: Self-pay | Admitting: Cardiothoracic Surgery

## 2016-08-25 ENCOUNTER — Ambulatory Visit
Admission: RE | Admit: 2016-08-25 | Discharge: 2016-08-25 | Disposition: A | Discharge status: Home / Self Care | Payer: Medicare PPO | Source: Ambulatory Visit | Attending: Cardiothoracic Surgery | Admitting: Cardiothoracic Surgery

## 2016-08-25 ENCOUNTER — Ambulatory Visit (INDEPENDENT_AMBULATORY_CARE_PROVIDER_SITE_OTHER): Payer: Medicare PPO | Admitting: Cardiothoracic Surgery

## 2016-08-25 VITALS — BP 161/91 | HR 67 | Temp 97.6°F | Ht 60.0 in | Wt 92.0 lb

## 2016-08-25 DIAGNOSIS — I2510 Atherosclerotic heart disease of native coronary artery without angina pectoris: Secondary | ICD-10-CM | POA: Diagnosis not present

## 2016-08-25 DIAGNOSIS — Z9889 Other specified postprocedural states: Secondary | ICD-10-CM | POA: Diagnosis present

## 2016-08-25 DIAGNOSIS — J9383 Other pneumothorax: Secondary | ICD-10-CM

## 2016-08-25 DIAGNOSIS — J449 Chronic obstructive pulmonary disease, unspecified: Secondary | ICD-10-CM | POA: Diagnosis not present

## 2016-08-25 NOTE — Progress Notes (Signed)
  Patient ID: Erica Cobb, female   DOB: 11-29-1940, 75 y.o.   MRN: [id number]  HISTORY: She returns today in follow-up. She suffered a recurrent pneumothorax on the right and is status post chest tube insertion with doxycycline pleurodesis. She states that she has not been short of breath. She's been walking. She denied any fevers or chills.   Vitals:   08/25/16 0957  BP: (!) 161/91  Pulse: 67  Temp: 97.6 F (36.4 C)     EXAM:    Resp: Lungs are clear bilaterally.  No respiratory distress, normal effort. Heart:  Regular without murmurs Abd:  Abdomen is soft, non distended and non tender. No masses are palpable.  There is no rebound and no guarding.  Neurological: Alert and oriented to person, place, and time. Coordination normal.  Skin: Skin is warm and dry. No rash noted. No diaphoretic. No erythema. No pallor.  the prior chest tube incision is now closed. There is only slight bruising around the incisions.  Psychiatric: Normal mood and affect. Normal behavior. Judgment and thought content normal.    ASSESSMENT: I have independently reviewed the patient's chest x-ray. There is no evidence of pneumothorax or pleural effusion.   PLAN:   I did not make a return visit for but would be happy to see her should the need arise. I told her to contact her primary care provider regarding the lesion on her left shoulder as well as the need for a pulmonologist. She will contact us if any further follow-up as needed.    Hulda Marinimothy Wenonah Milo, MD

## 2016-08-25 NOTE — Patient Instructions (Addendum)
Please give us a call if you have any questions or concerns. 

## 2016-08-27 DIAGNOSIS — J438 Other emphysema: Secondary | ICD-10-CM | POA: Insufficient documentation

## 2016-08-31 ENCOUNTER — Ambulatory Visit (INDEPENDENT_AMBULATORY_CARE_PROVIDER_SITE_OTHER): Payer: Medicare PPO | Admitting: Internal Medicine

## 2016-08-31 ENCOUNTER — Encounter: Payer: Self-pay | Admitting: Internal Medicine

## 2016-08-31 VITALS — BP 142/88 | HR 71 | Ht 60.0 in | Wt 93.0 lb

## 2016-08-31 DIAGNOSIS — J449 Chronic obstructive pulmonary disease, unspecified: Secondary | ICD-10-CM

## 2016-08-31 MED ORDER — ALBUTEROL SULFATE HFA 108 (90 BASE) MCG/ACT IN AERS: 2.0000 | INHALATION_SPRAY | RESPIRATORY_TRACT | 6 refills | Status: DC | PRN

## 2016-08-31 MED ORDER — FLUTICASONE FUROATE-VILANTEROL 200-25 MCG/INH IN AEPB
1.0000 | INHALATION_SPRAY | Freq: Every day | RESPIRATORY_TRACT | 0 refills | Status: DC
Start: 2016-08-31 — End: 2017-07-23

## 2016-08-31 MED ORDER — UMECLIDINIUM BROMIDE 62.5 MCG/INH IN AEPB: 1.0000 | INHALATION_SPRAY | Freq: Every day | RESPIRATORY_TRACT | 0 refills | Status: DC

## 2016-08-31 MED ORDER — FLUTICASONE FUROATE-VILANTEROL 200-25 MCG/INH IN AEPB: 1.0000 | INHALATION_SPRAY | Freq: Every day | RESPIRATORY_TRACT | 0 refills | Status: AC

## 2016-08-31 MED ORDER — UMECLIDINIUM BROMIDE 62.5 MCG/INH IN AEPB: 1.0000 | INHALATION_SPRAY | Freq: Every day | RESPIRATORY_TRACT | 0 refills | Status: AC

## 2016-08-31 NOTE — Patient Instructions (Signed)
Start BREO 200 Start Incruse ALbuterol as needed Start PULM rehab Check ONO  Chronic Obstructive Pulmonary Disease Chronic obstructive pulmonary disease (COPD) is a common lung condition in which airflow from the lungs is limited. COPD is a general term that can be used to describe many different lung problems that limit airflow, including both chronic bronchitis and emphysema. If you have COPD, your lung function will probably never return to normal, but there are measures you can take to improve lung function and make yourself feel better. CAUSES   Smoking (common).  Exposure to secondhand smoke.  Genetic problems.  Chronic inflammatory lung diseases or recurrent infections. SYMPTOMS  Shortness of breath, especially with physical activity.  Deep, persistent (chronic) cough with a large amount of thick mucus.  Wheezing.  Rapid breaths (tachypnea).  Gray or bluish discoloration (cyanosis) of the skin, especially in your fingers, toes, or lips.  Fatigue.  Weight loss.  Frequent infections or episodes when breathing symptoms become much worse (exacerbations).  Chest tightness. DIAGNOSIS Your health care provider will take a medical history and perform a physical examination to diagnose COPD. Additional tests for COPD may include:  Lung (pulmonary) function tests.  Chest X-ray.  CT scan.  Blood tests. TREATMENT  Treatment for COPD may include:  Inhaler and nebulizer medicines. These help manage the symptoms of COPD and make your breathing more comfortable.  Supplemental oxygen. Supplemental oxygen is only helpful if you have a low oxygen level in your blood.  Exercise and physical activity. These are beneficial for nearly all people with COPD.  Lung surgery or transplant.  Nutrition therapy to gain weight, if you are underweight.  Pulmonary rehabilitation. This may involve working with a team of health care providers and specialists, such as respiratory,  occupational, and physical therapists. HOME CARE INSTRUCTIONS  Take all medicines (inhaled or pills) as directed by your health care provider.  Avoid over-the-counter medicines or cough syrups that dry up your airway (such as antihistamines) and slow down the elimination of secretions unless instructed otherwise by your health care provider.  If you are a smoker, the most important thing that you can do is stop smoking. Continuing to smoke will cause further lung damage and breathing trouble. Ask your health care provider for help with quitting smoking. He or she can direct you to community resources or hospitals that provide support.  Avoid exposure to irritants such as smoke, chemicals, and fumes that aggravate your breathing.  Use oxygen therapy and pulmonary rehabilitation if directed by your health care provider. If you require home oxygen therapy, ask your health care provider whether you should purchase a pulse oximeter to measure your oxygen level at home.  Avoid contact with individuals who have a contagious illness.  Avoid extreme temperature and humidity changes.  Eat healthy foods. Eating smaller, more frequent meals and resting before meals may help you maintain your strength.  Stay active, but balance activity with periods of rest. Exercise and physical activity will help you maintain your ability to do things you want to do.  Preventing infection and hospitalization is very important when you have COPD. Make sure to receive all the vaccines your health care provider recommends, especially the pneumococcal and influenza vaccines. Ask your health care provider whether you need a pneumonia vaccine.  Learn and use relaxation techniques to manage stress.  Learn and use controlled breathing techniques as directed by your health care provider. Controlled breathing techniques include:  Pursed lip breathing. Start by breathing in (inhaling)  through your nose for 1 second. Then, purse  your lips as if you were going to whistle and breathe out (exhale) through the pursed lips for 2 seconds.  Diaphragmatic breathing. Start by putting one hand on your abdomen just above your waist. Inhale slowly through your nose. The hand on your abdomen should move out. Then purse your lips and exhale slowly. You should be able to feel the hand on your abdomen moving in as you exhale.  Learn and use controlled coughing to clear mucus from your lungs. Controlled coughing is a series of short, progressive coughs. The steps of controlled coughing are: 1. Lean your head slightly forward. 2. Breathe in deeply using diaphragmatic breathing. 3. Try to hold your breath for 3 seconds. 4. Keep your mouth slightly open while coughing twice. 5. Spit any mucus out into a tissue. 6. Rest and repeat the steps once or twice as needed. SEEK MEDICAL CARE IF:  You are coughing up more mucus than usual.  There is a change in the color or thickness of your mucus.  Your breathing is more labored than usual.  Your breathing is faster than usual. SEEK IMMEDIATE MEDICAL CARE IF:  You have shortness of breath while you are resting.  You have shortness of breath that prevents you from:  Being able to talk.  Performing your usual physical activities.  You have chest pain lasting longer than 5 minutes.  Your skin color is more cyanotic than usual.  You measure low oxygen saturations for longer than 5 minutes with a pulse oximeter. MAKE SURE YOU:  Understand these instructions.  Will watch your condition.  Will get help right away if you are not doing well or get worse.   This information is not intended to replace advice given to you by your health care provider. Make sure you discuss any questions you have with your health care provider.   Document Released: 08/02/2005 Document Revised: 11/13/2014 Document Reviewed: 06/19/2013 Elsevier Interactive Patient Education [employer].

## 2016-08-31 NOTE — Addendum Note (Signed)
Addended by: Meyer CoryAHMAD, MISTY R on: 08/31/2016 10:24 AM   Modules accepted: Orders

## 2016-08-31 NOTE — Progress Notes (Signed)
Patient ID: Erica FettersKaren Trefz, female   DOB: [birth year], 75 y.o.   MRN: [id number]  Patient seen in the office today and instructed on use of Breo and Incruse.  Patient expressed understanding and demonstrated technique.

## 2016-08-31 NOTE — Addendum Note (Signed)
Addended by: Meyer CoryAHMAD, MISTY R on: 08/31/2016 10:25 AM   Modules accepted: Orders

## 2016-08-31 NOTE — Progress Notes (Signed)
[facility] Pulmonary Medicine Consultation      Date: 08/31/2016,   MRN# [id number] Erica Cobb May 13, 1941 Code Status:  Code Status History    Date Active Date Inactive Code Status Order ID Comments User Context   08/10/2016  3:55 PM 08/16/2016  5:04 PM Full Code [id number]  Hulda Marin, MD ED     [facility] day:@LENGTHOFSTAYDAYS @ Referring MD: @ATDPROV @     PCP:      AdmissionWeight: 93 lb (42.2 kg)                 CurrentWeight: 93 lb (42.2 kg) Erica Cobb is a 75 y.o. old female seen in consultation for COPD at the request of Dr. Graciela Husbands.     CHIEF COMPLAINT:   SOB   HISTORY OF PRESENT ILLNESS   75 yo female seen today for chronic SOB/DOE Symptoms started 1 year ago, patient had progressive DOE when she subsequently developed acute SOB 2 months ago In 07/2016 she had been dx with Spontaneous Rt sided Pneumothorax s/p chest tube-this happened at [facility] 5 days later, patient had acute SOB again and on 10/5 patient had another spontaneous RT sided PTX was admitted to [facility] for further evaluation  Chest tube placed RT sided, noted to have lung expansion. Dr Thelma Barge was consulted and patient underwent Pleurodesis with doxycycline Patient was a former smoker, 1 ppd for 40 years quit 11 years ago s/p MI with stents, no previous PTX or pneumonia history Patient has no signs if infection at this time I obtained office [name] which shows ratio 47% and FEv1 48% Findings to suggest severe obstructive airways disease Patient has never used inhalers before   I obtained Office PSiro  PAST MEDICAL HISTORY   Past Medical History:  Diagnosis Date  . 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 Spontaneous pneumothorax      SURGICAL HISTORY   Past Surgical History:  Procedure Laterality Date  . CHEST TUBE INSERTION     for spontaneous pneumo  . stents  2006   LAD  Dr. Alben Spittle     FAMILY HISTORY   Family History  Problem Relation Age of Onset  . Cancer Mother     breast  . Heart disease  Father   . Heart disease Brother   . Heart disease Paternal Aunt      SOCIAL HISTORY   Social History  Substance Use Topics  . Smoking status: Former Games developer  . Smokeless tobacco: Never Used  . Alcohol use 2.4 oz/week    4 Cans of beer per week     Comment: socially     MEDICATIONS    Home Medication:  Current Outpatient Rx  . Order #: [id number] Class: Historical Med  . Order #: [id number] Class: Historical Med  . Order #: [id number] Class: Historical Med  . Order #: [id number] Class: Historical Med    Current Medication:  Current Outpatient Prescriptions:  .  aspirin EC 81 MG tablet, Take 1 tablet by mouth daily., Disp: , Rfl:  .  atorvastatin (LIPITOR) 40 MG tablet, Take 1 tablet by mouth daily., Disp: , Rfl:  .  lisinopril-hydrochlorothiazide (PRINZIDE,ZESTORETIC) 20-12.5 MG tablet, Take 1 tablet by mouth daily., Disp: , Rfl:  .  metoprolol succinate (TOPROL-XL) 100 MG 24 hr tablet, Take 1 tablet by mouth daily., Disp: , Rfl:     ALLERGIES   Review of patient's allergies indicates no known allergies.     REVIEW OF SYSTEMS   Review of Systems  Constitutional: Negative  for chills, diaphoresis, fever, malaise/fatigue and weight loss.  HENT: Negative for congestion and hearing loss.   Eyes: Negative for blurred vision and double vision.  Respiratory: Positive for shortness of breath. Negative for cough, hemoptysis, sputum production and wheezing.   Cardiovascular: Negative for chest pain, palpitations and orthopnea.  Gastrointestinal: Negative for abdominal pain, heartburn, nausea and vomiting.  Genitourinary: Negative for dysuria and urgency.  Musculoskeletal: Negative for back pain, myalgias and neck pain.  Skin: Negative for rash.  Neurological: Negative for dizziness, tingling, tremors, weakness and headaches.  Endo/Heme/Allergies: Does not bruise/bleed easily.  Psychiatric/Behavioral: Negative for depression, substance abuse and suicidal ideas.  All other  systems reviewed and are negative.    VS: BP (!) 142/88 (BP Location: Left Arm, Cuff Size: Normal)   Pulse 71   Ht 5' (1.524 m)   Wt 93 lb (42.2 kg)   SpO2 100%   BMI 18.16 kg/m      PHYSICAL EXAM  Physical Exam  Constitutional: She is oriented to person, place, and time. She appears well-developed and well-nourished. No distress.  HENT:  Head: Normocephalic and atraumatic.  Mouth/Throat: No oropharyngeal exudate.  Eyes: EOM are normal. Pupils are equal, round, and reactive to light. No scleral icterus.  Neck: Normal range of motion. Neck supple.  Cardiovascular: Normal rate, regular rhythm and normal heart sounds.   No murmur heard. Pulmonary/Chest: No stridor. No respiratory distress. She has no wheezes.  Abdominal: Soft. Bowel sounds are normal.  Musculoskeletal: Normal range of motion. She exhibits no edema.  Neurological: She is alert and oriented to person, place, and time. No cranial nerve deficit.  Skin: Skin is warm. She is not diaphoretic.  Psychiatric: She has a normal mood and affect.          IMAGING    Dg Chest 2 View  Result Date: 08/25/2016 CLINICAL DATA:  Recent pneumothorax. EXAM: CHEST  2 VIEW COMPARISON:  08/16/2016 . FINDINGS: Mediastinum and hilar structures are normal. COPD . Lungs are clear of acute infiltrates. Heart size normal. Coronary artery disease. No pleural effusion or pneumothorax. IMPRESSION: 1. COPD. No acute pulmonary disease. Interval resolution of right pleural effusion. No pneumothorax. 2. Coronary artery disease. Electronically Signed   By: Maisie Fushomas  Register   On: 08/25/2016 09:19   Dg Chest 2 View  Result Date: 08/16/2016 CLINICAL DATA:  Status post chest tube removal. EXAM: CHEST  2 VIEW COMPARISON:  08/16/2016 FINDINGS: There is interval removal of the right-sided chest tube. There is a trace right pleural effusion. There is no pneumothorax. The lungs are hyperinflated likely secondary to COPD. There is no focal parenchymal  opacity. The heart and mediastinal contours are unremarkable. The osseous structures are unremarkable. IMPRESSION: 1. Interval removal of the right-sided chest tube without a pneumothorax. 2. Small right pleural effusion. Electronically Signed   By: Elige KoHetal  Patel   On: 08/16/2016 10:38   Dg Chest 2 View  Result Date: 08/16/2016 CLINICAL DATA:  Spontaneous pneumothorax 2 weeks ago with right chest tube treatment. Follow-up study EXAM: CHEST  2 VIEW COMPARISON:  Portable chest x-ray of August 15, 2016 FINDINGS: A less than 5% apical pneumothorax on the right persists. The chest tube is in stable position. There is a small right pleural effusion which is also stable. The left lung remains hyperinflated and clear. The heart and pulmonary vascularity are normal. There is calcification in the wall of the aortic arch. The bony thorax exhibits no acute abnormality. IMPRESSION: Persistent 5% or less right apical  pneumothorax not significantly changed since yesterday's study. Stable small right pleural effusion. Aortic atherosclerosis. Electronically Signed   By: David  [place] M.D.   On: 08/16/2016 09:23   Dg Chest 2 View  Result Date: 08/12/2016 CLINICAL DATA:  Followup right-sided pneumothorax. EXAM: CHEST  2 VIEW COMPARISON:  Chest x-ray 08/11/2016 FINDINGS: The right-sided pigtail type pleural drainage catheter is in good position without complicating features. No definite residual pneumothorax identified. Stable tortuosity, ectasia and calcification of the thoracic aorta. Stable emphysematous changes. No acute pulmonary findings. IMPRESSION: Stable right-sided chest tube without pneumothorax. Electronically Signed   By: Rudie Meyer M.D.   On: 08/12/2016 16:16   Dg Chest 2 View  Result Date: 08/10/2016 CLINICAL DATA:  Shortness of breath.  Recent collapsed lung. EXAM: CHEST  2 VIEW COMPARISON:  None. FINDINGS: Approximately 70% right pneumothorax. Mild mediastinal shift to the left. Tortuous and calcified  thoracic aorta. The left lung is mildly hyperexpanded with mildly prominent interstitial markings. Mild scoliosis. Diffuse osteopenia. Coronary artery stent. IMPRESSION: 1. Approximately 70% right pneumothorax, probably under mild tension. 2. Mild changes of COPD. 3. Aortic atherosclerosis. Critical Value/emergent results were called by telephone at the time of interpretation on 08/10/2016 at 2:25 pm to Dr. Lacretia Nicks , who verbally acknowledged these results. Electronically Signed   By: Beckie Salts M.D.   On: 08/10/2016 14:27   Ct Chest W Contrast  Result Date: 08/11/2016 CLINICAL DATA:  Spont[place]othor[place]. Chest tube placed, patient released later that week. Yesterday she had acute SOB. It was determined she had another spontaneous pneumothorax and chest tube was placed. EXAM: CT CHEST WITH CONTRAST TECHNIQUE: Multidetector CT imaging of the chest was performed during intravenous contrast administration. CONTRAST:  75mL ISOVUE-300 IOPAMIDOL (ISOVUE-300) INJECTION 61% COMPARISON:  Chest radiograph 08/11/2016, 08/10/2009 FINDINGS: Cardiovascular: Coronary artery calcification and aortic atherosclerotic calcification. Mediastinum/Nodes: No axillary or supraclavicular lymphadenopathy. No mediastinal hilar adenopathy. No pericardial fluid. Esophagus normal. Lungs/Pleura: RIGHT chest tube in place with tip at the RIGHT lung apex. No residual pneumothorax evident. Small focus of rounded atelectasis in the medial RIGHT lower lobe. Pleural-parenchymal thickening with calcification in the posterior aspect of the LEFT lung apex. Centrilobular emphysema is noted in the upper lobes. Upper Abdomen: Limited view of the liver, kidneys, pancreas are unremarkable. Normal adrenal glands. Musculoskeletal: No aggressive osseous IMPRESSION: 1. RIGHT chest tube in place with no residual pneumothorax. 2. Centrilobular emphysema the upper lobes. 3. Focus of presumed round atelectasis in the RIGHT  lower lobe. 4. Pleural parenchymal thickening in the posterior LEFT upper lobe. Electronically Signed   By: Genevive Bi M.D.   On: 08/11/2016 09:44   Dg Chest Port 1 View  Result Date: 08/15/2016 CLINICAL DATA:  Acute onset shortness of breath 08/10/2016 secondary to a large right pneumothorax. Chest tube in place. EXAM: PORTABLE CHEST 1 VIEW COMPARISON:  PA and lateral chest 08/12/2016. FINDINGS: Pigtail catheter remains in place in the right chest. There is a tiny right apical pneumothorax, less than 5%. Small right pleural effusion is noted. Left lung is expanded and clear. Heart size is normal. IMPRESSION: Tiny right apical pneumothorax with a chest tube in place. Small right pleural effusion, new since the prior exam. Electronically Signed   By: Drusilla Kanner M.D.   On: 08/15/2016 08:31   Dg Chest Port 1 View  Result Date: 08/11/2016 CLINICAL DATA:  Post chest tube placement. EXAM: PORTABLE CHEST 1 VIEW COMPARISON:  08/10/2016 FINDINGS: Right chest tube in place.  No residual pneumothorax identified. Normal heart size and pulmonary vascularity. Emphysematous changes in the lungs. No blunting of costophrenic angles. Mediastinal contours appear intact. Calcification of the aorta. IMPRESSION: Left chest tube. No residual pneumothorax identified. Lungs are clear. Electronically Signed   By: Burman Nieves M.D.   On: 08/11/2016 06:25   Dg Chest Portable 1 View  Result Date: 08/10/2016 CLINICAL DATA:  Status post placement of right-sided chest tube for treatment of pneumothorax. EXAM: PORTABLE CHEST 1 VIEW COMPARISON:  Portable chest x-ray of August 10, 2016 which revealed an near-total right pneumothorax. FINDINGS: There are has been near total re-expansion of the right lung since placement of the right-sided small caliber chest tube. The tip of the tube lies in the right pulmonary apex. A faint pleural line is noted in the pulmonary apex. The left lung is well-expanded. There is no focal  infiltrate. There is no pleural effusion. The heart and pulmonary vascularity are normal. There is calcification in the wall of the aortic arch. The mediastinum is normal in width. The bony thorax exhibits no acute abnormality. IMPRESSION: Interval near total re-expansion of the right lung since right-sided chest tube placement. No acute cardiopulmonary abnormality otherwise. Underlying COPD-reactive airway disease.  Aortic atherosclerosis. Electronically Signed   By: David  [place] M.D.   On: 08/10/2016 16:05   Images reviewed 08/31/2016 Ct chest with evidence of emphysema CXR on 10/5 shows RT sided PTX   ASSESSMENT/PLAN   75 yo white female with recurrent RT sided Spontaneous PTX with underlying Emphysema and severe COPD Gold Stage B AT this time, her COPD seems to be at baseline. I explained to patient the findings of COPD and the severity of her disease. SHe understands her disease process better  1.start BREO 200 2.start Incruse 3.albuterol as needed 4.check ONO 5.start PULM REHAB  Follow up in 1 month to assess inhaler regimen   I have personally obtained a history, examined the patient, evaluated laboratory and independently reviewed imaging results, formulated the assessment and plan and placed orders.  The Patient requires high complexity decision making for assessment and support, frequent evaluation and titration of therapies, application of advanced monitoring technologies and extensive interpretation of multiple databases.    Patient/Family are satisfied with Plan of action and management. All questions answered  Lucie Leather, M.D.  [facility]  Medical Director [facility] Medical Director [facility] Cardio-Pulmonary Department

## 2016-09-25 ENCOUNTER — Ambulatory Visit (INDEPENDENT_AMBULATORY_CARE_PROVIDER_SITE_OTHER): Payer: Medicare PPO | Admitting: Internal Medicine

## 2016-09-25 ENCOUNTER — Encounter: Payer: Self-pay | Admitting: Internal Medicine

## 2016-09-25 VITALS — BP 120/68 | HR 88 | Ht 60.0 in | Wt 94.8 lb

## 2016-09-25 DIAGNOSIS — J449 Chronic obstructive pulmonary disease, unspecified: Secondary | ICD-10-CM

## 2016-09-25 NOTE — Progress Notes (Signed)
[facility]      Date: 09/25/2016,   MRN# [id number] Beatrix FettersKaren Satre 04/26/1941 Code Status:  Code Status History    Date Active Date Inactive Code Status Order ID Comments User Context   08/10/2016  3:55 PM 08/16/2016  5:04 PM Full Code [id number]  Hulda Marinimothy Oaks, MD ED     Hosp day:@LENGTHOFSTAYDAYS @ Referring MD: @ATDPROV @     PCP:      AdmissionWeight: 94 lb 12.8 oz (43 kg)                 CurrentWeight: 94 lb 12.8 oz (43 kg) Beatrix FettersKaren Wareing is a 75 y.o. old female seen in consultation for COPD at the request of Dr. Graciela HusbandsKlein.  Previous History 75 yo female seen today for chronic SOB/DOE In 07/2016 she had been dx with Spontaneous Rt sided Pneumothorax s/p chest tube-[facility], patient had acute SOB again and  on 10/5 patient had another spontaneous RT sided PTX [facility] Chest tube placed RT sided, noted to have lung expansion. Dr Thelma Bargeaks was consulted and patient underwent Pleurodesis with doxycycline Patient was a former smoker, 1 ppd for 40 years quit 11 years ago s/p MI with stents, no previous PTX or pneumonia history  CHIEF COMPLAINT:   SOB, Follow up COPD   HISTORY OF PRESENT ILLNESS   office Cleda DaubSpiro which shows ratio 47% and FEv1 48% Findings to suggest severe obstructive airways disease Patient has never used inhalers before  Has tried [place]o and Incruse and the inhalers gave her hoarse voice Her SOB has improved, has really well exercise tolerance Patient has no signs if infection at this time  No acute issues at this time    Current Medication:  Current Outpatient Prescriptions:  .  albuterol (PROVENTIL HFA;VENTOLIN HFA) 108 (90 Base) MCG/ACT inhaler, Inhale 2 puffs into the lungs every 4 (four) hours as needed for wheezing or shortness of breath., Disp: 1 Inhaler, Rfl: 6 .  aspirin EC 81 MG tablet, Take 1 tablet by mouth daily., Disp: , Rfl:  .  atorvastatin (LIPITOR) 40 MG  tablet, Take 1 tablet by mouth daily., Disp: , Rfl:  .  lisinopril-hydrochlorothiazide (PRINZIDE,ZESTORETIC) 20-12.5 MG tablet, Take 1 tablet by mouth daily., Disp: , Rfl:  .  metoprolol succinate (TOPROL-XL) 100 MG 24 hr tablet, Take 1 tablet by mouth daily., Disp: , Rfl:  .  fluticasone furoate-vilanterol (BREO ELLIPTA) 200-25 MCG/INH AEPB, Inhale 1 puff into the lungs daily. (Patient not taking: Reported on 09/25/2016), Disp: 60 each, Rfl: 0 .  umeclidinium bromide (INCRUSE ELLIPTA) 62.5 MCG/INH AEPB, Inhale 1 puff into the lungs daily. (Patient not taking: Reported on 09/25/2016), Disp: 30 each, Rfl: 0    ALLERGIES   Patient has no known allergies.     REVIEW OF SYSTEMS   Review of Systems  Constitutional: Negative for chills, diaphoresis, fever, malaise/fatigue and weight loss.  HENT: Negative for congestion and hearing loss.   Eyes: Negative for blurred vision and double vision.  Respiratory: Negative for cough, hemoptysis, sputum production, shortness of breath and wheezing.   Cardiovascular: Negative for chest pain, palpitations, orthopnea and leg swelling.  Gastrointestinal: Negative for abdominal pain, heartburn, nausea and vomiting.  Musculoskeletal: Negative for neck pain.  Skin: Negative for rash.  Neurological: Negative for weakness.  All other systems reviewed and are negative.    VS: BP 120/68 (BP Location: Left Arm, Cuff Size: Normal)   Pulse 88  Ht 5' (1.524 m)   Wt 94 lb 12.8 oz (43 kg)   SpO2 98%   BMI 18.51 kg/m      PHYSICAL EXAM  Physical Exam  Constitutional: She is oriented to person, place, and time. She appears well-developed and well-nourished. No distress.  HENT:  Mouth/Throat: No oropharyngeal exudate.  Neck: Neck supple.  Cardiovascular: Normal rate, regular rhythm and normal heart sounds.   No murmur heard. Pulmonary/Chest: Effort normal and breath sounds normal. No stridor. No respiratory distress. She has no wheezes.    Musculoskeletal: Normal range of motion. She exhibits no edema.  Neurological: She is alert and oriented to person, place, and time. No cranial nerve deficit.  Skin: Skin is warm. She is not diaphoretic.  Psychiatric: She has a normal mood and affect.     IMAGING    CT chest with evidence of emphysema CXR on 10/5 shows RT sided PTX   ASSESSMENT/PLAN   75 yo white female with recurrent RT sided Spontaneous PTX with underlying Emphysema and severe COPD Gold Stage A AT this time, her COPD seems to be at baseline. I explained to patient the findings of COPD and the severity of her disease. SHe understands her disease process better    1.albuterol as needed 2.recommend check ONO, patient will decide on this or not  3.start PULM REHAB-patient will also decide on this or not  Follow up in 3 months    The Patient requires high complexity decision making for assessment and support, frequent evaluation and titration of therapies, application of advanced monitoring technologies and extensive interpretation of multiple databases.    Patient/Family are satisfied with Plan of action and management. All questions answered  Lucie LeatherKurian David Dawn Kiper, M.D.  [facility]  Medical Director [facility] Medical Director [facility] Cardio[facility] Department

## 2016-09-25 NOTE — Patient Instructions (Signed)
Chronic Obstructive Pulmonary Disease Chronic obstructive pulmonary disease (COPD) is a common lung problem. In COPD, the flow of air from the lungs is limited. The way your lungs work will probably never return to normal, but there are things you can do to improve your lungs and make yourself feel better. Your doctor may treat your condition with:  Medicines.  Oxygen.  Lung surgery.  Changes to your diet.  Rehabilitation. This may involve a team of specialists. Follow these instructions at home:  Take all medicines as told by your doctor.  Avoid medicines or cough syrups that dry up your airway (such as antihistamines) and do not allow you to get rid of thick spit. You do not need to avoid them if told differently by your doctor.  If you smoke, stop. Smoking makes the problem worse.  Avoid being around things that make your breathing worse (like smoke, chemicals, and fumes).  Use oxygen therapy and therapy to help improve your lungs (pulmonary rehabilitation) if told by your doctor. If you need home oxygen therapy, ask your doctor if you should buy a tool to measure your oxygen level (oximeter).  Avoid people who have a sickness you can catch (contagious).  Avoid going outside when it is very hot, cold, or humid.  Eat healthy foods. Eat smaller meals more often. Rest before meals.  Stay active, but remember to also rest.  Make sure to get all the shots (vaccines) your doctor recommends. Ask your doctor if you need a pneumonia shot.  Learn and use tips on how to relax.  Learn and use tips on how to control your breathing as told by your doctor. Try: 1. Breathing in (inhaling) through your nose for 1 second. Then, pucker your lips and breath out (exhale) through your lips for 2 seconds. 2. Putting one hand on your belly (abdomen). Breathe in slowly through your nose for 1 second. Your hand on your belly should move out. Pucker your lips and breathe out slowly through your lips.  Your hand on your belly should move in as you breathe out.  Learn and use controlled coughing to clear thick spit from your lungs. The steps are: 1. Lean your head a little forward. 2. Breathe in deeply. 3. Try to hold your breath for 3 seconds. 4. Keep your mouth slightly open while coughing 2 times. 5. Spit any thick spit out into a tissue. 6. Rest and do the steps again 1 or 2 times as needed. Contact a doctor if:  You cough up more thick spit than usual.  There is a change in the color or thickness of the spit.  It is harder to breathe than usual.  Your breathing is faster than usual. Get help right away if:  You have shortness of breath while resting.  You have shortness of breath that stops you from:  Being able to talk.  Doing normal activities.  You chest hurts for longer than 5 minutes.  Your skin color is more blue than usual.  Your pulse oximeter shows that you have low oxygen for longer than 5 minutes. This information is not intended to replace advice given to you by your health care provider. Make sure you discuss any questions you have with your health care provider. Document Released: 04/10/2008 Document Revised: 03/30/2016 Document Reviewed: 06/19/2013 Elsevier Interactive Patient Education  2017 [employer]  

## 2017-07-18 ENCOUNTER — Ambulatory Visit: Payer: Medicare PPO | Admitting: Internal Medicine

## 2017-07-23 ENCOUNTER — Encounter: Payer: Self-pay | Admitting: Internal Medicine

## 2017-07-23 ENCOUNTER — Ambulatory Visit (INDEPENDENT_AMBULATORY_CARE_PROVIDER_SITE_OTHER): Payer: Medicare PPO | Admitting: Internal Medicine

## 2017-07-23 VITALS — BP 108/80 | HR 89 | Resp 16 | Ht 60.0 in | Wt 91.8 lb

## 2017-07-23 DIAGNOSIS — J449 Chronic obstructive pulmonary disease, unspecified: Secondary | ICD-10-CM

## 2017-07-23 NOTE — Patient Instructions (Addendum)
Will check PFT's in 6 months Re-assess  and ONO in 6 months when patient is ready

## 2017-07-23 NOTE — Progress Notes (Signed)
[facility] [place] Pulmonary Medicine Consultation      Date: 07/23/2017,   MRN# [id number] Erica Cobb 1941/04/01 Code Status:  Code Status History    Date Active Date Inactive Code Status Order ID Comments User Context   08/10/2016  3:55 PM 08/16/2016  5:04 PM Full Code [id number]  Erica Marin, MD ED     Hosp day:@LENGTHOFSTAYDAYS @ Referring MD: @     PCP:      AdmissionWeight: 91 lb 12.8 oz (41.6 kg)                 CurrentWeight: 91 lb 12.8 oz (41.6 kg) Remmington Teters is a 76 y.o. old female seen in consultation for COPD at the request of Dr. Graciela Husbands.  Previous History 76 yo female seen today for chronic SOB/DOE In 07/2016 she had been dx with Spontaneous Rt sided Pneumothorax s/p chest tube-this happened at [facility] 5 days later, patient had acute SOB again and  on 10/5 patient had another spontaneous RT sided PTX was admitted to [facility] for further evaluation Chest tube placed RT sided, noted to have lung expansion. Dr Thelma Barge was consulted and patient underwent Pleurodesis with doxycycline Patient was a former smoker, 1 ppd for 40 years quit 11 years ago s/p MI with stents, no previous PTX or pneumonia history  CHIEF COMPLAINT:   SOB, Follow up COPD   HISTORY OF PRESENT ILLNESS  office [name] which shows ratio 47% and FEv1 48% Findings to suggest severe obstructive airways disease  Has tried Virgel Bouquet and Incruse and the inhalers gave her hoarse voice +SOB with exertion with incline Patient has no signs if infection at this time  No acute issues at this time  She will need to be asess for hypoxia but refuses to be tested at this time   Current Medication:  Current Outpatient Prescriptions:  .  aspirin EC 81 MG tablet, Take 1 tablet by mouth daily., Disp: , Rfl:  .  atorvastatin (LIPITOR) 40 MG tablet, Take 1 tablet by mouth daily., Disp: , Rfl:  .  lisinopril-hydrochlorothiazide (PRINZIDE,ZESTORETIC) 20-12.5 MG tablet, Take 1 tablet by mouth daily., Disp: , Rfl:  .   metoprolol succinate (TOPROL-XL) 100 MG 24 hr tablet, Take 1 tablet by mouth daily., Disp: , Rfl:  .  albuterol (PROVENTIL HFA;VENTOLIN HFA) 108 (90 Base) MCG/ACT inhaler, Inhale 2 puffs into the lungs every 4 (four) hours as needed for wheezing or shortness of breath. (Patient not taking: Reported on 07/23/2017), Disp: 1 Inhaler, Rfl: 6    ALLERGIES   Patient has no known allergies.     REVIEW OF SYSTEMS   Review of Systems  Constitutional: Negative for chills, diaphoresis, fever, malaise/fatigue and weight loss.  HENT: Negative for congestion and hearing loss.   Eyes: Negative for blurred vision and double vision.  Respiratory: Negative for cough, hemoptysis, sputum production, shortness of breath and wheezing.   Cardiovascular: Negative for chest pain, palpitations, orthopnea and leg swelling.  Gastrointestinal: Negative for abdominal pain, heartburn, nausea and vomiting.  Musculoskeletal: Negative for neck pain.  Skin: Negative for rash.  Neurological: Negative for weakness.  All other systems reviewed and are negative.    VS: BP 108/80 (BP Location: Left Arm, Cuff Size: Normal)   Pulse 89   Resp 16   Ht 5' (1.524 m)   Wt 91 lb 12.8 oz (41.6 kg)   SpO2 96%   BMI 17.93 kg/m      PHYSICAL EXAM  Physical Exam  Constitutional: She is oriented to  person, place, and time. She appears well-developed and well-nourished. No distress.  HENT:  Mouth/Throat: No oropharyngeal exudate.  Neck: Neck supple.  Cardiovascular: Normal rate, regular rhythm and normal heart sounds.   No murmur heard. Pulmonary/Chest: Effort normal and breath sounds normal. No stridor. No respiratory distress. She has no wheezes.  Musculoskeletal: Normal range of motion. She exhibits no edema.  Neurological: She is alert and oriented to person, place, and time. No cranial nerve deficit.  Skin: Skin is warm. She is not diaphoretic.  Psychiatric: She has a normal mood and affect.     IMAGING    CT  chest with evidence of emphysema CXR on 10/5 shows RT sided PTX   ASSESSMENT/PLAN   76 yo white female with recurrent RT sided Spontaneous PTX with underlying Emphysema and severe COPD Gold Stage A AT this time, her COPD seems to be at baseline.  I explained to patient the findings of COPD and the severity of her disease.  SHe understands her disease process better She refuses to be tested for hypoxia with 6 walk and ONO  at this time however she is willing to reassess her pulmonate function testing in approximately 6 months    1.albuterol as needed 2.recommend checking  ONO,6MWT will discuss at next visit 3.check PFTs at next visit in 6 months  Follow up in 6 months   Patient/Family are satisfied with Plan of action and management. All questions answered  Lucie Leather, M.D.  [facility]  Medical Director [facility] Medical Director [facility] Cardio-Pulmonary Department

## 2017-12-26 ENCOUNTER — Telehealth: Payer: Self-pay | Admitting: Internal Medicine

## 2017-12-26 NOTE — Telephone Encounter (Signed)
Patient needs pft prior to appt   Patient also believes she needs a sleep test   Please call patient

## 2017-12-27 NOTE — Telephone Encounter (Signed)
LMOAM for pt to return call to schedule PFT prior to March appointment. Rhonda J Cobb

## 2017-12-27 NOTE — Telephone Encounter (Signed)
Appointment for PFT scheduled for [facility]/18 [facility]  Pt to arrive at 8:15 [facility]  Waiting on pt to return call. Rhonda J Cobb

## 2017-12-28 NOTE — Telephone Encounter (Signed)
[name] in October to arrange ONO, "pt advised [facility] that she did not want to do ONO". Being that this order has expired, we will see patient on 01/21/18 and if Dr. Belia HemanKasa still wants this done on patient, a new order will be placed to coincide with OV for insurance guidelines.  Checked with Misty and this is also what she advised me to do.    LMOAM for pt of the above message and advised patient that if she had any questions or concerns to contact me at [phone]. Rhonda J Cobb

## 2017-12-28 NOTE — Telephone Encounter (Signed)
Pt returned called and left a message on my VM that she wanted to cancel the PFT. She wanted to arrange the ONO that was ordered on 08/31/16.  Waiting on [facility] to respond if they ever was able to reach patient to schedule. Rhonda J Cobb

## 2018-01-10 IMAGING — DX DG CHEST 1V PORT
1 series · 1 of 1 positions shown · non-contrast
Comparison: PA and lateral chest 08/12/2016.

CLINICAL DATA: Acute onset shortness of breath 08/10/2016 secondary
to a large right pneumothorax. Chest tube in place.

EXAM:
PORTABLE CHEST 1 VIEW

[chest ap]
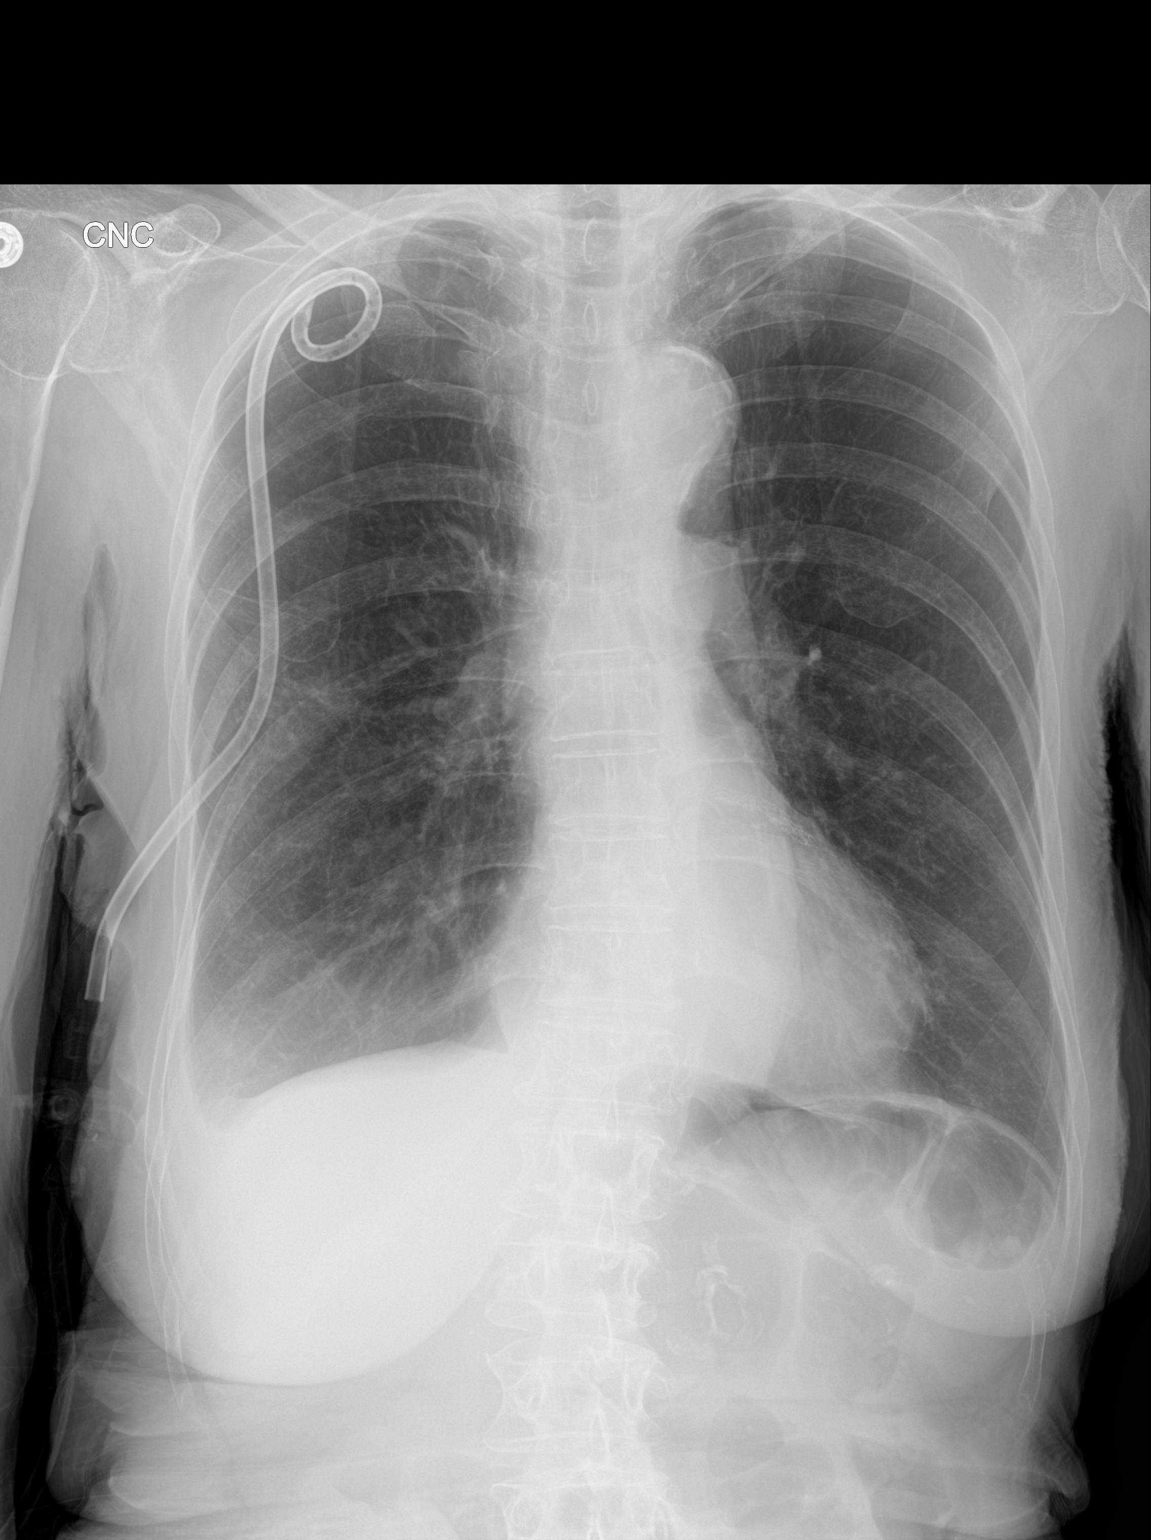

[1 of 1 positions shown; findings below may reference images not displayed]

FINDINGS: Pigtail catheter remains in place in the right chest. There is a
tiny right apical pneumothorax, less than 5%. Small right pleural
effusion is noted. Left lung is expanded and clear. Heart size is
normal.
IMPRESSION: Tiny right apical pneumothorax with a chest tube in place.

Small right pleural effusion, new since the prior exam.

## 2018-01-11 IMAGING — CR DG CHEST 2V
1 series · 2 of 2 positions shown · non-contrast
Comparison: Portable chest x-ray August 15, 2016

CLINICAL DATA: Spontaneous pneumothorax 2 weeks ago with right
chest tube treatment. Follow-up study

EXAM:
CHEST  2 VIEW

[Series 1: dg chest 2 view · 0.14mm/px · 2 of 2 slices shown]
[im 1/2]
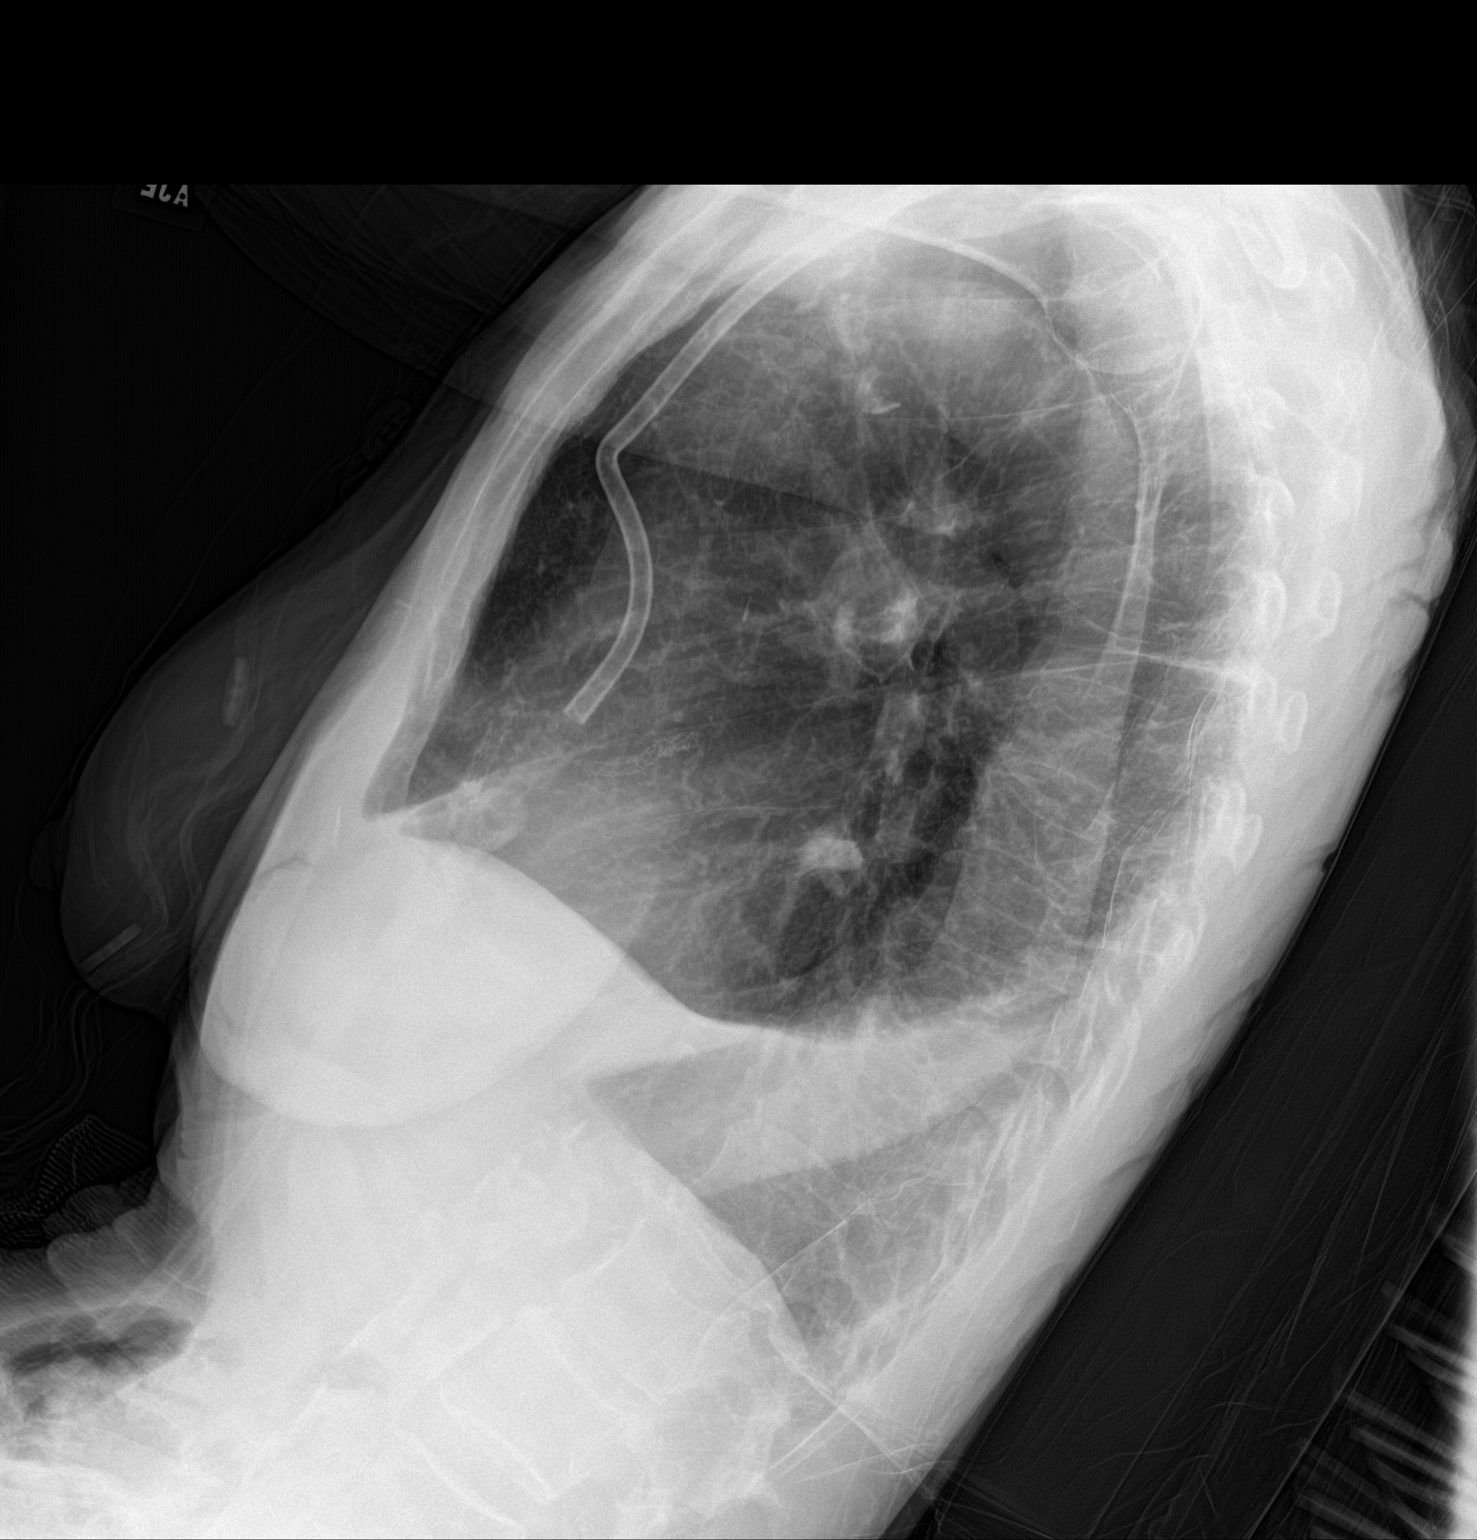
[im 2/2]
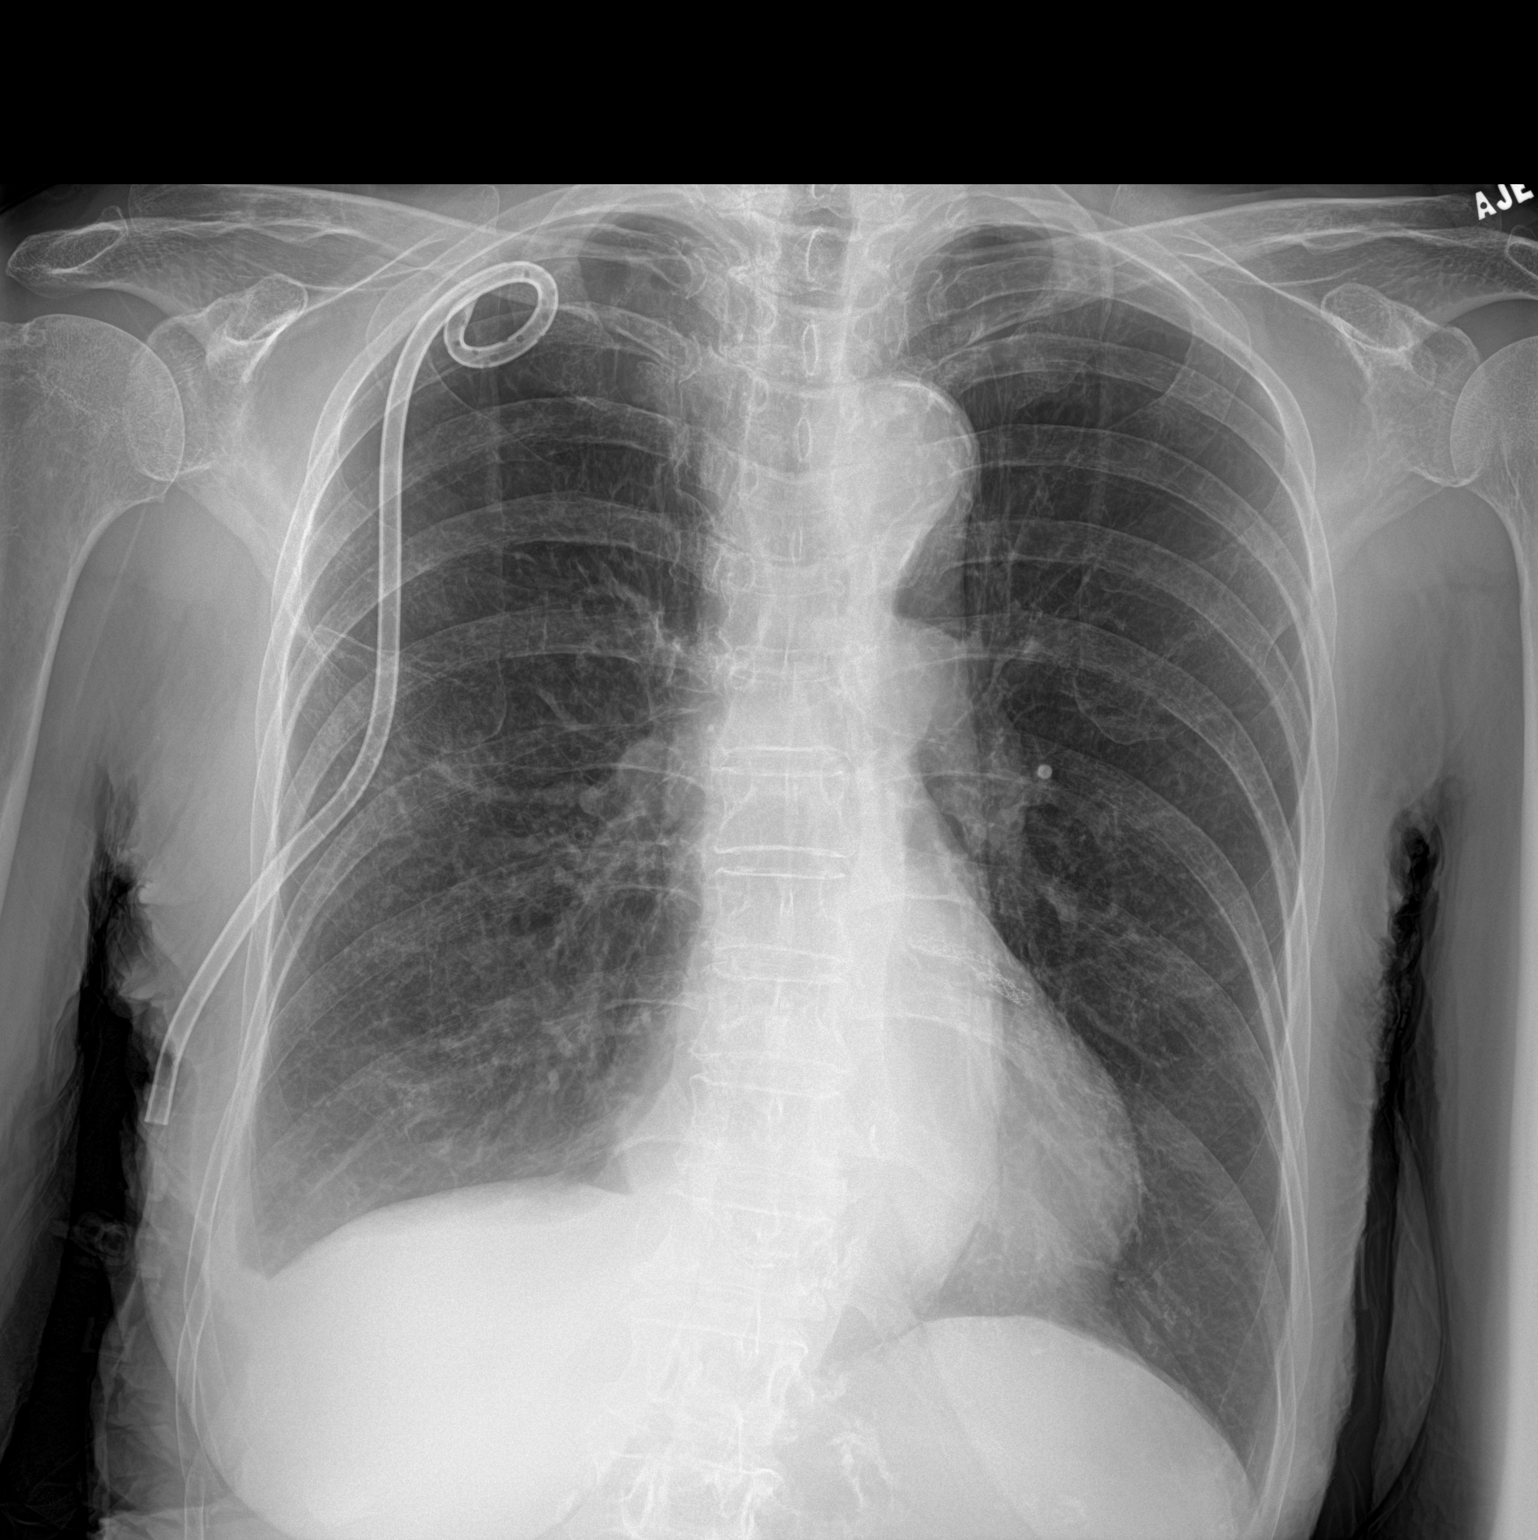

[2 of 2 positions shown; findings below may reference images not displayed]

FINDINGS: A less than 5% apical pneumothorax on the right persists. The chest
tube is in stable position. There is a small right pleural effusion
which is also stable. The left lung remains hyperinflated and clear.
The heart and pulmonary vascularity are normal. There is
calcification in the wall of the aortic arch. The bony thorax
exhibits no acute abnormality.
IMPRESSION: Persistent 5% or less right apical pneumothorax not significantly
changed since yesterday's study. Stable small right pleural
effusion.

Aortic atherosclerosis.

## 2018-01-15 ENCOUNTER — Ambulatory Visit: Payer: Medicare PPO

## 2018-01-21 ENCOUNTER — Ambulatory Visit: Payer: Medicare PPO | Admitting: Internal Medicine

## 2018-01-21 ENCOUNTER — Encounter: Payer: Self-pay | Admitting: Internal Medicine

## 2018-01-21 VITALS — BP 116/72 | HR 83 | Resp 16 | Ht 60.0 in | Wt 94.0 lb

## 2018-01-21 DIAGNOSIS — J449 Chronic obstructive pulmonary disease, unspecified: Secondary | ICD-10-CM

## 2018-01-21 NOTE — Progress Notes (Signed)
[facility] [place] Pulmonary Medicine Consultation      Date: 01/21/2018,   MRN# [id number] Erica Cobb [birth year] Code Status:  Code Status History    Date Active Date Inactive Code Status Order ID Comments User Context   08/10/2016  3:55 PM 08/16/2016  5:04 PM Full Code [id number]  Hulda Marin, MD ED     [facility] day:@LENGTHOFSTAYDAYS @ Referring MD: @ATDPROV @     PCP:      AdmissionWeight: 94 lb (42.6 kg)                 CurrentWeight: 94 lb (42.6 kg) Erica Cobb is a 77 y.o. old female seen in consultation for COPD at the request of Dr. Graciela Husbands.  Previous History 76 yo female seen today for chronic SOB/DOE In 07/2016 she had been dx with Spontaneous Rt sided Pneumothorax s/p chest tube-this happened at [facility] 5 days later, patient had acute SOB again and  on 10/5 patient had another spontaneous RT sided PTX was admitted to [facility] for further evaluation Chest tube placed RT sided, noted to have lung expansion. Dr Thelma Barge was consulted and patient underwent Pleurodesis with doxycycline Patient was a former smoker, 1 ppd for 40 years quit 11 years ago s/p MI with stents, no previous PTX or pneumonia history  CHIEF COMPLAINT:   SOB, Follow up COPD   HISTORY OF PRESENT ILLNESS  office [name] which shows ratio 47% and FEv1 48% Findings to suggest severe obstructive airways disease  Has tried Virgel Bouquet and Incruse and the inhalers gave her hoarse voice +SOB with exertion with incline however, she is very acttive and no real SOb/DOE occasional cough  Has not used albuterol over 6 months  Patient has no signs if infection at this time  No acute issues at this time    Current Medication:  Current Outpatient Medications:  .  albuterol (PROVENTIL HFA;VENTOLIN HFA) 108 (90 Base) MCG/ACT inhaler, Inhale 2 puffs into the lungs every 4 (four) hours as needed for wheezing or shortness of breath., Disp: 1 Inhaler, Rfl: 6 .  aspirin EC 81 MG tablet, Take 1 tablet by mouth daily., Disp: , Rfl:  .   atorvastatin (LIPITOR) 40 MG tablet, Take 1 tablet by mouth daily., Disp: , Rfl:  .  lisinopril-hydrochlorothiazide (PRINZIDE,ZESTORETIC) 20-12.5 MG tablet, Take 1 tablet by mouth daily., Disp: , Rfl:  .  metoprolol succinate (TOPROL-XL) 100 MG 24 hr tablet, Take 1 tablet by mouth daily., Disp: , Rfl:     ALLERGIES   Patient has no known allergies.     REVIEW OF SYSTEMS   Review of Systems  Constitutional: Negative for chills, diaphoresis, fever, malaise/fatigue and weight loss.  HENT: Negative for congestion and hearing loss.   Eyes: Negative for blurred vision and double vision.  Respiratory: Negative for cough, hemoptysis, sputum production, shortness of breath and wheezing.   Cardiovascular: Negative for chest pain, palpitations, orthopnea and leg swelling.  Gastrointestinal: Negative for abdominal pain, heartburn, nausea and vomiting.  Musculoskeletal: Negative for neck pain.  Skin: Negative for rash.  Neurological: Negative for weakness.  All other systems reviewed and are negative.    VS: BP 116/72 (BP Location: Left Arm, Cuff Size: Normal)   Pulse 83   Resp 16   Ht 5' (1.524 m)   Wt 94 lb (42.6 kg)   SpO2 98%   BMI 18.36 kg/m      PHYSICAL EXAM  Physical Exam  Constitutional: She is oriented to person, place, and time. She appears well-developed and  well-nourished. No distress.  HENT:  Mouth/Throat: No oropharyngeal exudate.  Neck: Neck supple.  Cardiovascular: Normal rate, regular rhythm and normal heart sounds.  No murmur heard. Pulmonary/Chest: Effort normal and breath sounds normal. No stridor. No respiratory distress. She has no wheezes.  Musculoskeletal: Normal range of motion. She exhibits no edema.  Neurological: She is alert and oriented to person, place, and time. No cranial nerve deficit.  Skin: Skin is warm. She is not diaphoretic.  Psychiatric: She has a normal mood and affect.     IMAGING    CT chest with evidence of emphysema CXR on  10/5 shows RT sided PTX   ASSESSMENT/PLAN   77 yo white female with recurrent RT sided Spontaneous PTX with underlying Emphysema and severe COPD Gold Stage A AT this time, her COPD seems to be at baseline.  I explained to patient the findings of COPD and the severity of her disease.  SHe understands her disease process better  No indication for steroids or CXR at this time No indication for testing for hypoxia at this time  Continue Albuterol as needed  Follow up in 1 year   Patient  satisfied with Plan of action and management. All questions answered  Lucie LeatherKurian David Dedrick Heffner, M.D.  [facility]  Medical Director [facility] Medical Director [facility] Cardio-Pulmonary Department

## 2018-01-21 NOTE — Patient Instructions (Signed)
Continue albuterol as needed 

## 2019-11-26 ENCOUNTER — Other Ambulatory Visit: Payer: Self-pay

## 2019-11-27 ENCOUNTER — Ambulatory Visit: Payer: Medicare PPO | Attending: Infectious Diseases | Admitting: Infectious Diseases

## 2019-11-27 ENCOUNTER — Encounter: Payer: Self-pay | Admitting: Infectious Diseases

## 2019-11-27 ENCOUNTER — Ambulatory Visit
Admission: RE | Admit: 2019-11-27 | Discharge: 2019-11-27 | Disposition: A | Discharge status: Home / Self Care | Payer: Medicare PPO | Source: Ambulatory Visit | Attending: Infectious Diseases | Admitting: Infectious Diseases

## 2019-11-27 ENCOUNTER — Other Ambulatory Visit
Admission: RE | Admit: 2019-11-27 | Discharge: 2019-11-27 | Disposition: A | Discharge status: Home / Self Care | Payer: Medicare PPO | Source: Home / Self Care | Attending: Infectious Diseases | Admitting: Infectious Diseases

## 2019-11-27 VITALS — BP 159/89 | HR 78 | Temp 97.7°F | Resp 16 | Ht 60.0 in | Wt 91.0 lb

## 2019-11-27 DIAGNOSIS — Z87891 Personal history of nicotine dependence: Secondary | ICD-10-CM | POA: Diagnosis not present

## 2019-11-27 DIAGNOSIS — A311 Cutaneous mycobacterial infection: Secondary | ICD-10-CM | POA: Insufficient documentation

## 2019-11-27 DIAGNOSIS — A318 Other mycobacterial infections: Secondary | ICD-10-CM

## 2019-11-27 DIAGNOSIS — Z8249 Family history of ischemic heart disease and other diseases of the circulatory system: Secondary | ICD-10-CM | POA: Insufficient documentation

## 2019-11-27 DIAGNOSIS — I2510 Atherosclerotic heart disease of native coronary artery without angina pectoris: Secondary | ICD-10-CM | POA: Diagnosis not present

## 2019-11-27 DIAGNOSIS — J9383 Other pneumothorax: Secondary | ICD-10-CM

## 2019-11-27 DIAGNOSIS — Z85820 Personal history of malignant melanoma of skin: Secondary | ICD-10-CM | POA: Diagnosis not present

## 2019-11-27 DIAGNOSIS — Z7982 Long term (current) use of aspirin: Secondary | ICD-10-CM | POA: Insufficient documentation

## 2019-11-27 DIAGNOSIS — Z955 Presence of coronary angioplasty implant and graft: Secondary | ICD-10-CM

## 2019-11-27 DIAGNOSIS — Z803 Family history of malignant neoplasm of breast: Secondary | ICD-10-CM | POA: Diagnosis not present

## 2019-11-27 DIAGNOSIS — Z79899 Other long term (current) drug therapy: Secondary | ICD-10-CM | POA: Insufficient documentation

## 2019-11-27 DIAGNOSIS — I252 Old myocardial infarction: Secondary | ICD-10-CM | POA: Insufficient documentation

## 2019-11-27 DIAGNOSIS — L089 Local infection of the skin and subcutaneous tissue, unspecified: Secondary | ICD-10-CM

## 2019-11-27 DIAGNOSIS — Z95828 Presence of other vascular implants and grafts: Secondary | ICD-10-CM

## 2019-11-27 LAB — COMPREHENSIVE METABOLIC PANEL
ALT: 16 U/L (ref 0–44)
AST: 24 U/L (ref 15–41)
Albumin: 4 g/dL (ref 3.5–5.0)
Alkaline Phosphatase: 59 U/L (ref 38–126)
Anion gap: 9 (ref 5–15)
BUN: 23 mg/dL (ref 8–23)
CO2: 32 mmol/L (ref 22–32)
Calcium: 9.7 mg/dL (ref 8.9–10.3)
Chloride: 102 mmol/L (ref 98–111)
Creatinine, Ser: 0.85 mg/dL (ref 0.44–1.00)
GFR calc Af Amer: >60 mL/min (ref >60)
GFR calc non Af Amer: >60 mL/min (ref >60)
Glucose, Bld: 109 mg/dL — ABNORMAL HIGH (ref 70–99)
Potassium: 3.6 mmol/L (ref 3.5–5.1)
Sodium: 143 mmol/L (ref 135–145)
Total Bilirubin: 0.9 mg/dL (ref 0.3–1.2)
Total Protein: 7.2 g/dL (ref 6.5–8.1)

## 2019-11-27 LAB — CBC WITH DIFFERENTIAL/PLATELET
Abs Immature Granulocytes: 0.01 10*3/uL (ref 0.00–0.07)
Basophils Absolute: 0 10*3/uL (ref 0.0–0.1)
Basophils Relative: 0 %
Eosinophils Absolute: 0.1 10*3/uL (ref 0.0–0.5)
Eosinophils Relative: 1 %
HCT: 41.8 % (ref 36.0–46.0)
Hemoglobin: 13.5 g/dL (ref 12.0–15.0)
Immature Granulocytes: 0 %
Lymphocytes Relative: 24 %
Lymphs Abs: 1.2 10*3/uL (ref 0.7–4.0)
MCH: 31.4 pg (ref 26.0–34.0)
MCHC: 32.3 g/dL (ref 30.0–36.0)
MCV: 97.2 fL (ref 80.0–100.0)
Monocytes Absolute: 0.4 10*3/uL (ref 0.1–1.0)
Monocytes Relative: 7 %
Neutro Abs: 3.4 10*3/uL (ref 1.7–7.7)
Neutrophils Relative %: 68 %
Platelets: 177 10*3/uL (ref 150–400)
RBC: 4.3 MIL/uL (ref 3.87–5.11)
RDW: 13 % (ref 11.5–15.5)
WBC: 5.1 10*3/uL (ref 4.0–10.5)
nRBC: 0 % (ref 0.0–0.2)

## 2019-11-27 LAB — SEDIMENTATION RATE: Sed Rate: 9 mm/hr (ref 0–30)

## 2019-11-27 NOTE — Progress Notes (Signed)
NAME: Erica Cobb  DOB: 1940/11/28  MRN: [id number]  Date/Time: 11/27/2019 10:55 AM  REQUESTING PROVIDER Subjective:  REASON FOR CONSULT:  ? Erica Cobb is a 79 y.o. 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 Pt saw Dr.Daher ( derm ) in Sept 2020 for various skin lesions due to sun damage.. was diagnosed with actininc keratosis  Over forehead, scalp, cheek and treated with 5 fluoracil topical cream. And also had liquid nitrogen. 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 She then underwent excision of the melanoma in situ on 08/29/19- pathology  came back as clear margins. The surgical site on 09/12/19 had some slime and culture sent and she was started on Doxycycline. It grew pseudomonas and MSSA. During her visit on 10/17/19 violaceous nodules were seen and a punch biopsy taken for HPE . 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 Spontaneous pneumothorax     Past Surgical History:  Procedure Laterality Date  . CHEST TUBE INSERTION     for spontaneous pneumo  . stents  2006   LAD  Dr. Alben Spittle    Social History   Socioeconomic  History  . Marital status: Married    Spouse name: Not on file  . Number of children: Not on file  . Years of education: Not on file  . Highest education level: Not on file  Occupational History  . Not on file  Tobacco Use  . Smoking status: Former Games developer  . Smokeless tobacco: Never Used  Substance and Sexual Activity  . Alcohol use: Yes    Alcohol/week: 4.0 standard drinks    Types: 4 Cans of beer per week    Comment: socially  . Drug use: No  . Sexual activity: Not on file  Other Topics Concern  . Not on file  Social History Narrative  . Not on file   Social Determinants of Health   Financial Resource Strain:   . Difficulty of Paying Living Expenses: Not on file  Food Insecurity:   . Worried About Programme researcher, broadcasting/film/video in the Last Year: Not on file  . Ran Out of Food in the Last Year: Not on file  Transportation Needs:   . Lack of Transportation (Medical): Not on file  . Lack of Transportation (Non-Medical): Not on file  Physical Activity:   . Days of Exercise per Week: Not on file  . Minutes of Exercise per Session: Not on file  Stress:   . Feeling of Stress : Not on file  Social Connections:   .  Frequency of Communication with Friends and Family: Not on file  . Frequency of Social Gatherings with Friends and Family: Not on file  . Attends Religious Services: Not on file  . Active Member of Clubs or Organizations: Not on file  . Attends Banker Meetings: Not on file  . Marital Status: Not on file  Intimate Partner Violence:   . Fear of Current or Ex-Partner: Not on file  . Emotionally Abused: Not on file  . Physically Abused: Not on file  . Sexually Abused: Not on file    Family History  Problem Relation Age of Onset  . Cancer Mother        breast  . Heart disease Father   . Heart disease Brother   . Heart disease Paternal Aunt    No Known Allergies  ? Current Outpatient Medications  Medication Sig Dispense Refill  . aspirin EC 81 MG  tablet Take by mouth.    Marland Kitchen atorvastatin (LIPITOR) 40 MG tablet TAKE 1 TABLET EVERY DAY    . lisinopril-hydrochlorothiazide (ZESTORETIC) 20-12.5 MG tablet TAKE 1 TABLET EVERY DAY    . metoprolol succinate (TOPROL-XL) 100 MG 24 hr tablet TAKE 1 TABLET EVERY DAY    . nitroGLYCERIN (NITROSTAT) 0.4 MG SL tablet DISSOLVE 1 TABLET UNDER THE TONGUE EVERY 5 MINUTES AS NEEDED FOR CHEST PAIN, MAY TAKE UP TO 3 DOSES     No current facility-administered medications for this visit.     Abtx:  Anti-infectives (From admission, onward)   None      REVIEW OF SYSTEMS:  Const: negative fever, negative chills, negative weight loss Eyes: negative diplopia or visual changes, negative eye pain ENT: negative coryza, negative sore throat Resp: negative cough, hemoptysis, dyspnea Cards: negative for chest pain, palpitations, lower extremity edema GU: negative for frequency, dysuria and hematuria GI: Negative for abdominal pain, diarrhea, bleeding, constipation Skin: negative for rash and pruritus Heme: negative for easy bruising and gum/nose bleeding MS: negative for myalgias, arthralgias, back pain and muscle weakness Neurolo:negative for headaches, dizziness, vertigo, memory problems  Psych: negative for feelings of anxiety, depression  Endocrine: negative for thyroid, diabetes Allergy/Immunology- negative for any medication or food allergies ?  Objective:  VITALS:  BP (!) 159/89   Pulse 78   Temp 97.7 F (36.5 C)   Resp 16   Ht 5' (1.524 m)   Wt 91 lb (41.3 kg)   SpO2 93%   BMI 17.77 kg/m  PHYSICAL EXAM:  General: Alert, cooperative, no distress, appears stated age. Thin bulit weighs 41 Kg Head: Normocephalic, without obvious abnormality, atraumatic. Eyes: Conjunctivae clear, anicteric sclerae. Pupils are equal ENT Nares normal. No drainage or sinus tenderness. Lips, mucosa, and tongue normal. No Thrush Neck: Supple, symmetrical, no adenopathy, thyroid: non tender no carotid bruit and no  JVD. Back: No CVA tenderness. Lungs: Clear to auscultation bilaterally. No Wheezing or Rhonchi. No rales. Heart: Regular rate and rhythm, no murmur, rub or gallop. Abdomen: Soft, non-tender,not distended. Bowel sounds normal. No masses Extremities: rt calf- violaceous nodules/ulceration over the surgical scar area    Skin: severly sun damaged skin Lymph: Cervical, supraclavicular normal. Neurologic: Grossly non-focal    Microbiology:      IMAGING RESULTS: none  ? Impression/Recommendation ? 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 ?Ex smoker Spontaneous Pneumothorax- COPD phenotype- but not on any meds  ___________________________________________________ Discussed with patient, will follow up with her after the results/CXR

## 2019-11-27 NOTE — Patient Instructions (Signed)
You have been diagnosed with mycobacterium chelonae skin/soft tissue  infection- will get some labs today and decide whether we can do Jut one antibiotic Which is clarithromycin or very likely will need 2 antibiotic combination  Once I have the results will all you and discuss meds

## 2019-12-02 ENCOUNTER — Ambulatory Visit: Payer: Medicare PPO | Attending: Infectious Diseases | Admitting: Infectious Diseases

## 2019-12-02 ENCOUNTER — Other Ambulatory Visit: Payer: Self-pay

## 2019-12-02 DIAGNOSIS — A311 Cutaneous mycobacterial infection: Secondary | ICD-10-CM | POA: Diagnosis not present

## 2019-12-02 MED ORDER — MINOCYCLINE HCL 100 MG PO TABS: 100.0000 mg | ORAL_TABLET | Freq: Two times a day (BID) | ORAL | 0 refills | Status: DC

## 2019-12-02 MED ORDER — AZITHROMYCIN 500 MG PO TABS: 500.0000 mg | ORAL_TABLET | Freq: Every day | ORAL | 0 refills | Status: DC

## 2019-12-02 NOTE — Progress Notes (Signed)
The purpose of this virtual visit is to provide medical care while limiting exposure to the novel coronavirus (COVID19) for both patient and office staff.   Consent was obtained for phone visit:  Yes.   Answered questions that patient had about telehealth interaction:  Yes.   I discussed the limitations, risks, security and privacy concerns of performing an evaluation and management service by telephone. I also discussed with the patient that there may be a patient responsible charge related to this service. The patient expressed understanding and agreed to proceed.   Patient Location: Home Provider Location:office  Follow up for phone visitfor  starting medication for m.chelonae skin and soft tissue infection Recent labs CBC/CMP/CXR normal  CBC Latest Ref Rng & Units 11/27/2019 08/10/2016  WBC 4.0 - 10.5 K/uL 5.1 4.9  Hemoglobin 12.0 - 15.0 g/dL 32.4 40.1  Hematocrit 02.7 - 46.0 % 41.8 39.9  Platelets 150 - 400 K/uL 177 195   CMP Latest Ref Rng & Units 11/27/2019 08/10/2016  Glucose 70 - 99 mg/dL 253(G) 644(I)  BUN 8 - 23 mg/dL 23 16  Creatinine 3.47 - 1.00 mg/dL 4.25 9.56  Sodium 387 - 145 mmol/L 143 139  Potassium 3.5 - 5.1 mmol/L 3.6 3.8  Chloride 98 - 111 mmol/L 102 101  CO2 22 - 32 mmol/L 32 32  Calcium 8.9 - 10.3 mg/dL 9.7 9.5  Total Protein 6.5 - 8.1 g/dL 7.2 6.9  Total Bilirubin 0.3 - 1.2 mg/dL 0.9 0.7  Alkaline Phos 38 - 126 U/L 59 53  AST 15 - 41 U/L 24 27  ALT 0 - 44 U/L 16 15     M.chelonae      Susceptibiltiy  antibiotics Macrolide linezolid Minocycline Amikacin Tobramycin  Intermediate  cipro imipenem     Assessment/Plan:  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 If she starts experiencing significant side effects with minocycline then we can proceed with macrolide alone  Other oral option is linezolid Pt weighs 91 ibs and so azithromycin dose may be adjusted later if side effects to 500mg   Macrolide is the back bone of therapy  Discussed side effects with patient. Minocycline- photosensitivity, rash, dizziness, yeast infection, fatigue, headache etc Discussed food interactions and not take dairy, minerals, antacids, iron together  No significant drug interactions with azithromycin, minocycline and her other meds ( atrovastatin, zestoretic, aspirin ( may reduce zithromax level) metoprolol. Follow up in 2 weeks

## 2019-12-16 ENCOUNTER — Other Ambulatory Visit: Payer: Self-pay | Admitting: Infectious Diseases

## 2020-01-01 ENCOUNTER — Encounter: Payer: Self-pay | Admitting: Infectious Diseases

## 2020-01-01 ENCOUNTER — Other Ambulatory Visit: Payer: Self-pay

## 2020-01-01 ENCOUNTER — Ambulatory Visit: Payer: Medicare PPO | Attending: Infectious Diseases | Admitting: Infectious Diseases

## 2020-01-01 ENCOUNTER — Other Ambulatory Visit
Admission: RE | Admit: 2020-01-01 | Discharge: 2020-01-01 | Disposition: A | Discharge status: Home / Self Care | Payer: Medicare PPO | Source: Ambulatory Visit | Attending: Infectious Diseases | Admitting: Infectious Diseases

## 2020-01-01 VITALS — BP 142/88 | HR 70 | Resp 16 | Ht 60.0 in | Wt 88.0 lb

## 2020-01-01 DIAGNOSIS — Z7982 Long term (current) use of aspirin: Secondary | ICD-10-CM | POA: Diagnosis not present

## 2020-01-01 DIAGNOSIS — A311 Cutaneous mycobacterial infection: Secondary | ICD-10-CM | POA: Diagnosis present

## 2020-01-01 DIAGNOSIS — Z955 Presence of coronary angioplasty implant and graft: Secondary | ICD-10-CM | POA: Insufficient documentation

## 2020-01-01 DIAGNOSIS — I2510 Atherosclerotic heart disease of native coronary artery without angina pectoris: Secondary | ICD-10-CM | POA: Insufficient documentation

## 2020-01-01 DIAGNOSIS — J449 Chronic obstructive pulmonary disease, unspecified: Secondary | ICD-10-CM | POA: Insufficient documentation

## 2020-01-01 DIAGNOSIS — I252 Old myocardial infarction: Secondary | ICD-10-CM | POA: Insufficient documentation

## 2020-01-01 DIAGNOSIS — Z87891 Personal history of nicotine dependence: Secondary | ICD-10-CM | POA: Diagnosis not present

## 2020-01-01 DIAGNOSIS — Z8249 Family history of ischemic heart disease and other diseases of the circulatory system: Secondary | ICD-10-CM | POA: Diagnosis not present

## 2020-01-01 DIAGNOSIS — A318 Other mycobacterial infections: Secondary | ICD-10-CM | POA: Diagnosis not present

## 2020-01-01 DIAGNOSIS — Z803 Family history of malignant neoplasm of breast: Secondary | ICD-10-CM | POA: Diagnosis not present

## 2020-01-01 DIAGNOSIS — Z79899 Other long term (current) drug therapy: Secondary | ICD-10-CM | POA: Diagnosis not present

## 2020-01-01 LAB — COMPREHENSIVE METABOLIC PANEL
ALT: 29 U/L (ref 0–44)
AST: 32 U/L (ref 15–41)
Albumin: 3.2 g/dL — ABNORMAL LOW (ref 3.5–5.0)
Alkaline Phosphatase: 62 U/L (ref 38–126)
Anion gap: 7 (ref 5–15)
BUN: 26 mg/dL — ABNORMAL HIGH (ref 8–23)
CO2: 31 mmol/L (ref 22–32)
Calcium: 9.8 mg/dL (ref 8.9–10.3)
Chloride: 100 mmol/L (ref 98–111)
Creatinine, Ser: 0.91 mg/dL (ref 0.44–1.00)
GFR calc Af Amer: >60 mL/min (ref >60)
GFR calc non Af Amer: >60 mL/min (ref >60)
Glucose, Bld: 94 mg/dL (ref 70–99)
Potassium: 4.4 mmol/L (ref 3.5–5.1)
Sodium: 138 mmol/L (ref 135–145)
Total Bilirubin: 0.7 mg/dL (ref 0.3–1.2)
Total Protein: 6.1 g/dL — ABNORMAL LOW (ref 6.5–8.1)

## 2020-01-01 LAB — CBC WITH DIFFERENTIAL/PLATELET
Abs Immature Granulocytes: 0.02 10*3/uL (ref 0.00–0.07)
Basophils Absolute: 0 10*3/uL (ref 0.0–0.1)
Basophils Relative: 1 %
Eosinophils Absolute: 0.1 10*3/uL (ref 0.0–0.5)
Eosinophils Relative: 2 %
HCT: 39.4 % (ref 36.0–46.0)
Hemoglobin: 12.8 g/dL (ref 12.0–15.0)
Immature Granulocytes: 0 %
Lymphocytes Relative: 22 %
Lymphs Abs: 1 10*3/uL (ref 0.7–4.0)
MCH: 31.4 pg (ref 26.0–34.0)
MCHC: 32.5 g/dL (ref 30.0–36.0)
MCV: 96.8 fL (ref 80.0–100.0)
Monocytes Absolute: 0.4 10*3/uL (ref 0.1–1.0)
Monocytes Relative: 9 %
Neutro Abs: 3 10*3/uL (ref 1.7–7.7)
Neutrophils Relative %: 66 %
Platelets: 174 10*3/uL (ref 150–400)
RBC: 4.07 MIL/uL (ref 3.87–5.11)
RDW: 12.7 % (ref 11.5–15.5)
WBC: 4.6 10*3/uL (ref 4.0–10.5)
nRBC: 0 % (ref 0.0–0.2)

## 2020-01-01 MED ORDER — AZITHROMYCIN 500 MG PO TABS: 500.0000 mg | ORAL_TABLET | Freq: Every day | ORAL | 1 refills | Status: DC

## 2020-01-01 NOTE — Progress Notes (Signed)
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 She has side effects- stomach aches, has lot of gas, nausea, headaches and light headed. No loss of appetite, eating okay but says she lost 3 pounds Pt did not take 3 days of medicine ( the day she got the covid vaccine, and this week she stopped the meds on Tuesday) Also has some sob ( baseline) - is seeing pulmonologist - Dr.Kasa Saw Cardiologist on 12/23/19- BP high - EKG is scheduled for tomorrow The leg lesion is improving She did not call as told regarding side effects and we had to call her and make this appt  ? 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 Pt saw Dr.Dasher ( derm ) in Sept 2020 for various skin lesions due to sun damage.. was diagnosed with actininc keratosis  Over forehead, scalp, cheek and treated with 5 fluoracil topical cream. And also had liquid nitrogen. 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 She then underwent excision of the melanoma in situ on 08/29/19- pathology  came back as clear margins. The surgical site on 09/12/19 had some slime and culture sent and she was started on Doxycycline. It grew pseudomonas and MSSA. During her visit on 10/17/19 violaceous nodules were seen and a punch biopsy taken for HPE . 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 On 10/23/19 another punch biopsy was sent for AFB culture and she was placed on minocycline and ciprofloxacin  but patient was reghtly asked not to take it and wait for culture report after dermatologist discussed with 2 ID physicians ( including me) The culture resulted as mycobacterium chelonae on 11/19/19 and I was asked to see the patient    Past Medical History:  Diagnosis Date  . 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 Spontaneous pneumothorax   CAD with stents HTN   Past Surgical History:  Procedure Laterality Date  . CHEST TUBE INSERTION     for spontaneous pneumo  . stents  2006   LAD  Dr. Alben Spittle    Social History   Socioeconomic History  . Marital status: Married    Spouse name: Not on file  . Number of children: Not on file  . Years of education: Not on file  . Highest education level: Not on file  Occupational History  . Not on file  Tobacco Use  . Smoking status: Former Games developer  . Smokeless tobacco: Never Used  Substance and Sexual Activity  . Alcohol use: Yes    Alcohol/week: 4.0 standard drinks    Types: 4 Cans of beer per week    Comment: socially  . Drug use: No  . Sexual activity: Not on file  Other Topics Concern  . Not on file  Social History Narrative  . Not on file   Social Determinants of Health   Financial Resource Strain:   . Difficulty of Paying Living Expenses: Not on file  Food  Insecurity:   . Worried About Programme researcher, broadcasting/film/video in the Last Year: Not on file  . Ran Out of Food in the Last Year: Not on file  Transportation Needs:   . Lack of Transportation (Medical): Not on file  . Lack of Transportation (Non-Medical): Not on file  Physical Activity:   . Days of Exercise per Week: Not on file  . Minutes of Exercise per Session: Not on file  Stress:   . Feeling of Stress : Not on file  Social Connections:   . Frequency of Communication with Friends and Family: Not on file  . Frequency of Social Gatherings with Friends and Family: Not on file  . Attends Religious Services: Not on file  . Active Member of Clubs or Organizations: Not on file  . Attends Occupational hygienist Meetings: Not on file  . Marital Status: Not on file  Intimate Partner Violence:   . Fear of Current or Ex-Partner: Not on file  . Emotionally Abused: Not on file  . Physically Abused: Not on file  . Sexually Abused: Not on file    Family History  Problem Relation Age of Onset  . Cancer Mother        breast  . Heart disease Father   . Heart disease Brother   . Heart disease Paternal Aunt    No Known Allergies  ? Current Outpatient Medications  Medication Sig Dispense Refill  . aspirin EC 81 MG tablet Take by mouth.    Marland Kitchen atorvastatin (LIPITOR) 40 MG tablet TAKE 1 TABLET EVERY DAY    . azithromycin (ZITHROMAX) 500 MG tablet Take 1 tablet (500 mg total) by mouth daily. 30 tablet 0  . lisinopril-hydrochlorothiazide (ZESTORETIC) 20-12.5 MG tablet TAKE 1 TABLET EVERY DAY    . metoprolol succinate (TOPROL-XL) 100 MG 24 hr tablet TAKE 1 TABLET EVERY DAY    . minocycline (DYNACIN) 100 MG tablet Take 1 tablet (100 mg total) by mouth 2 (two) times daily. 60 tablet 0  . nitroGLYCERIN (NITROSTAT) 0.4 MG SL tablet DISSOLVE 1 TABLET UNDER THE TONGUE EVERY 5 MINUTES AS NEEDED FOR CHEST PAIN, MAY TAKE UP TO 3 DOSES     No current facility-administered medications for this visit.     Abtx:  Anti-infectives (From admission, onward)   None      REVIEW OF SYSTEMS:  Const: negative fever, negative chills, 4 pound weight loss Eyes: negative diplopia or visual changes, negative eye pain ENT: negative coryza, negative sore throat Resp: negative cough,has dyspnea Cards: negative for chest pain, palpitations, lower extremity edema GU: negative for frequency, dysuria and hematuria GI: stomach aches, no diarrhea,no loss of appetite, has gas,  Skin:  rash leg Heme: negative for easy bruising and gum/nose bleeding MS: negative for myalgias, arthralgias, back pain and muscle weakness Neurolo: headaches, dizziness, No vertigo, memory problems  Psych: negative for feelings of anxiety,  depression  Endocrine: negative for thyroid, diabetes Allergy/Immunology- negative for any medication or food allergies ?  Objective:  VITALS:  BP (!) 142/88   Pulse 70   Resp 16   Ht 5' (1.524 m)   Wt 88 lb (39.9 kg)   SpO2 93%   BMI 17.19 kg/m  PHYSICAL EXAM:  General: Alert, cooperative, no distress, appears stated age. Thin bulit weighs 40 Kg Head: Normocephalic, without obvious abnormality, atraumatic. Eyes: Conjunctivae clear, anicteric sclerae. Pupils are equal ENT Nares normal. No drainage or sinus tenderness. Lips, mucosa, and tongue normal. No Thrush Neck: Supple,  symmetrical, no adenopathy, thyroid: non tender no carotid bruit and no JVD. Back: No CVA tenderness. Lungs: Clear to auscultation bilaterally. No Wheezing or Rhonchi. No rales. Heart: Regular rate and rhythm, no murmur, rub or gallop. Abdomen: Soft, non-tender,not distended. Bowel sounds normal. No masses Extremities: rt calf- violaceous nodules/ulceration over the surgical scar area       Skin: severly sun damaged skin Lymph: Cervical, supraclavicular normal. Neurologic: Grossly non-focal    Microbiology:      IMAGING RESULTS: none  ? Impression/Recommendation ? 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 at the site of prior surgery for melanoma in situ The bacteria  susceptible to macrolide, minocycline, linezolid, tobramycin She was started on azithromycin and minocycline on 12/04/19 and she has Gi side effects and dizziness which is very likely due to minocycline- she has stopped both meds 2 days ago As the lesion is localized and improving will continue only with azithromycin 500mg  once a day- if this dose is not tolerable we may reduce to 250mg   Informed her about calling us if she continues to have side effects I will be away but Dr.Fitzgerald will be covering me and can be reached by contacting my office   CAD with stents current meds -aspirin,  metoprolol, zestoretic, atorvastatin Has EKG scheduled tommorw ? ?Ex smoker Spontaneous Pneumothorax- COPD phenotype- but not on any meds- will be seeing Dr.Kasa on 01/20/20  Will follow up in 1 month- March 30th- virtual visit

## 2020-01-01 NOTE — Patient Instructions (Signed)
You are here for follow up of the mycobacterium chelonae infection of the rt calf- we started you on azithromycin and minocycline on 12/04/19 and as you had GI side effects and dizziness you stopped them after nearly 4 weeks- your skin has improved- so we will just give the azithromycin alone and stop minocycline which likely caused the side effects. Please all our office if you continue to experience side- effects and we may then reduce the azithromycin to 250mg  Take high calorie food   Jan 2021

## 2020-01-20 ENCOUNTER — Other Ambulatory Visit: Payer: Self-pay

## 2020-01-20 ENCOUNTER — Encounter: Payer: Self-pay | Admitting: Internal Medicine

## 2020-01-20 ENCOUNTER — Ambulatory Visit: Payer: Medicare PPO | Admitting: Internal Medicine

## 2020-01-20 VITALS — BP 132/80 | HR 93 | Ht 60.0 in | Wt 88.4 lb

## 2020-01-20 DIAGNOSIS — J449 Chronic obstructive pulmonary disease, unspecified: Secondary | ICD-10-CM

## 2020-01-20 MED ORDER — ALBUTEROL SULFATE HFA 108 (90 BASE) MCG/ACT IN AERS: 2.0000 | INHALATION_SPRAY | RESPIRATORY_TRACT | 6 refills | Status: AC | PRN

## 2020-01-20 MED ORDER — SPIRIVA RESPIMAT 1.25 MCG/ACT IN AERS: 2.0000 | INHALATION_SPRAY | Freq: Every day | RESPIRATORY_TRACT | 10 refills | Status: DC

## 2020-01-20 NOTE — Patient Instructions (Addendum)
START Spiriva RESPIMAT 1.25 USE AS DIRECTED   ALBUTEROL 2 PUFFS EVERY 4 HRS AS NEEDED FOR COUGH/SOB   AVOID SECOND HAND SMOKE

## 2020-01-20 NOTE — Progress Notes (Addendum)
[facility] Consultation      Date: 01/20/2020,   MRN# [id number] Lakeitha Basques 1940-12-31     Admission                  Current  Devany Aja is a 78 y.o. old female seen in consultation for COPD at the request of Dr. Graciela Husbands.  Previous History 79 yo female seen today for chronic SOB/DOE In 07/2016 she had been dx with Spontaneous Rt sided Pneumothorax s/p chest tube-this happened at [facility] 5 days later, patient had acute SOB again and  on 10/5 patient had another spontaneous RT sided PTX was admitted to [facility] for further evaluation Chest tube placed RT sided, noted to have lung expansion. Dr Thelma Barge was consulted and patient underwent Pleurodesis with doxycycline Patient was a former smoker, 1 ppd for 40 years quit 11 years ago s/p MI with stents, no previous PTX or pneumonia history Previous office spirometry showed 47% predicted 48% predicted for FEV1 which suggests severe obstructive airways disease    Previous office spirometry shows ratio 47% predicted   CHIEF COMPLAINT:   Follow-up COPD   HISTORY OF PRESENT ILLNESS   Patient has chronic stable shortness of breath and dyspnea exertion that seems to be more moderate has increased fatigue with exetion She has tried multiple inhalers in the past with Virgel Bouquet and Incruse She states she had a hoarse voice with it  No COPD exacerbation at this time No evidence of heart failure at this time No evidence or signs of infection at this time No respiratory distress No fevers, chills, nausea, vomiting, diarrhea No evidence of lower extremity edema No evidence hemoptysis  I have explained to her that her disease may have progressed and that she may need to be tested for exertional nocturnal hypoxia Patient does not want to be tested at this time    Current Medication:  Current Outpatient Medications:  .  aspirin EC 81 MG tablet, Take by mouth., Disp: , Rfl:  .  atorvastatin (LIPITOR) 40 MG tablet, TAKE 1  TABLET EVERY DAY, Disp: , Rfl:  .  azithromycin (ZITHROMAX) 500 MG tablet, Take 1 tablet (500 mg total) by mouth daily., Disp: 30 tablet, Rfl: 1 .  lisinopril-hydrochlorothiazide (ZESTORETIC) 20-12.5 MG tablet, TAKE 1 TABLET EVERY DAY, Disp: , Rfl:  .  metoprolol succinate (TOPROL-XL) 100 MG 24 hr tablet, TAKE 1 TABLET EVERY DAY, Disp: , Rfl:  .  nitroGLYCERIN (NITROSTAT) 0.4 MG SL tablet, DISSOLVE 1 TABLET UNDER THE TONGUE EVERY 5 MINUTES AS NEEDED FOR CHEST PAIN, MAY TAKE UP TO 3 DOSES, Disp: , Rfl:     ALLERGIES   Patient has no known allergies.     REVIEW OF SYSTEMS   Review of Systems  Constitutional: Negative for chills, diaphoresis, fever, malaise/fatigue and weight loss.  HENT: Negative for congestion and hearing loss.   Eyes: Negative for blurred vision and double vision.  Respiratory: Negative for cough, hemoptysis, sputum production, shortness of breath and wheezing.   Cardiovascular: Negative for chest pain, palpitations, orthopnea and leg swelling.  Gastrointestinal: Negative for abdominal pain, heartburn, nausea and vomiting.  Musculoskeletal: Negative for neck pain.  Skin: Negative for rash.  Neurological: Negative for weakness.  All other systems reviewed and are negative.  BP 132/80 (BP Location: Left Arm, Cuff Size: Normal)   Pulse 93   Ht 5' (1.524 m)   Wt 88 lb 6.4 oz (40.1 kg)   SpO2 96%   BMI 17.26 kg/m  PHYSICAL EXAM  Physical Exam  Constitutional: She is oriented to person, place, and time. She appears well-developed and well-nourished. No distress.  HENT:  Mouth/Throat: No oropharyngeal exudate.  Cardiovascular: Normal rate, regular rhythm and normal heart sounds.  No murmur heard. Pulmonary/Chest: Effort normal and breath sounds normal. No stridor. No respiratory distress. She has no wheezes.  Musculoskeletal:        General: No edema. Normal range of motion.     Cervical back: Neck supple.  Neurological: She is alert and oriented to  person, place, and time. No cranial nerve deficit.  Skin: Skin is warm. She is not diaphoretic.  Psychiatric: She has a normal mood and affect.     IMAGING    CT chest with evidence of emphysema CXR on 10/5 shows RT sided PTX   ASSESSMENT/PLAN   79 year old white female with history of recurrent right-sided spontaneous pneumothorax with underlying emphysema and severe COPD Gold stage B  Her symptoms some to have progressed over the last several years She has increased work of breathing and dyspnea on exertion COPD and severity of her disease explained to patient  No indication for steroids or chest x-ray at this time No indication for antibiotics at this time   At this time I would recommend and starting 1.25 1.25 Also use albuterol 2 puffs every 4 hours as needed  Patient did not want to to get tested for exertional and nocturnal hypoxia at this time I strongly recommend testing for exertional and nocturnal hypoxia at next office visit if her symptoms do not dissipate  COVID-19 EDUCATION: The signs and symptoms of COVID-19 were discussed with the patient and how to seek care for testing.  The importance of social distancing was discussed today. Hand Washing Techniques and avoid touching face was advised.     MEDICATION ADJUSTMENTS/LABS AND TESTS ORDERED: Spiriva Respimat 1.25 Albuterol 2 puffs every 4 hours as needed   CURRENT MEDICATIONS REVIEWED AT LENGTH WITH PATIENT TODAY   Patient satisfied with Plan of action and management. All questions answered  Follow up in 3 months   Maryjo Ragon Patricia Pesa, M.D.  [facility]  Medical Director [place] Director [facility] Cardio-Pulmonary Department

## 2020-01-29 ENCOUNTER — Telehealth: Payer: Medicare PPO | Admitting: Infectious Diseases

## 2020-02-05 ENCOUNTER — Encounter: Payer: Self-pay | Admitting: Infectious Diseases

## 2020-02-05 ENCOUNTER — Ambulatory Visit: Payer: Medicare PPO | Attending: Infectious Diseases | Admitting: Infectious Diseases

## 2020-02-05 ENCOUNTER — Other Ambulatory Visit: Payer: Self-pay

## 2020-02-05 VITALS — BP 184/87 | HR 89 | Resp 16 | Ht 60.0 in | Wt 88.0 lb

## 2020-02-05 DIAGNOSIS — I2510 Atherosclerotic heart disease of native coronary artery without angina pectoris: Secondary | ICD-10-CM | POA: Insufficient documentation

## 2020-02-05 DIAGNOSIS — Z8249 Family history of ischemic heart disease and other diseases of the circulatory system: Secondary | ICD-10-CM | POA: Diagnosis not present

## 2020-02-05 DIAGNOSIS — A318 Other mycobacterial infections: Secondary | ICD-10-CM | POA: Diagnosis not present

## 2020-02-05 DIAGNOSIS — I252 Old myocardial infarction: Secondary | ICD-10-CM | POA: Diagnosis not present

## 2020-02-05 DIAGNOSIS — Z79899 Other long term (current) drug therapy: Secondary | ICD-10-CM | POA: Insufficient documentation

## 2020-02-05 DIAGNOSIS — Z7982 Long term (current) use of aspirin: Secondary | ICD-10-CM | POA: Insufficient documentation

## 2020-02-05 DIAGNOSIS — Z955 Presence of coronary angioplasty implant and graft: Secondary | ICD-10-CM | POA: Insufficient documentation

## 2020-02-05 DIAGNOSIS — I10 Essential (primary) hypertension: Secondary | ICD-10-CM | POA: Diagnosis not present

## 2020-02-05 DIAGNOSIS — Z85828 Personal history of other malignant neoplasm of skin: Secondary | ICD-10-CM | POA: Insufficient documentation

## 2020-02-05 DIAGNOSIS — Z87891 Personal history of nicotine dependence: Secondary | ICD-10-CM | POA: Diagnosis not present

## 2020-02-05 DIAGNOSIS — A311 Cutaneous mycobacterial infection: Secondary | ICD-10-CM

## 2020-02-05 NOTE — Patient Instructions (Addendum)
You are here for follow up of the skin infection on your rt calf . You started azithromycin and minocycline on 12/02/19 and on 01/01/20 we stopped minocycline as it was causing side ffects including dizziness, nausea, headaches. As you skin is not progressing the way we want it to we will have to add another antibiotic called linezolid  02/05/20   01/01/20   11/27/19   Please call us with your current medication list so that we can check drug interactions. Also will check with your insurance company Will let you know soon

## 2020-02-05 NOTE — Progress Notes (Signed)
NAME: Erica Cobb  DOB: May 14, 1941  MRN: [id number]  Date/Time: 02/05/2020 11:07 AM  REQUESTING PROVIDER Subjective:  Follow up  for mycobacterium chelonae-    ? 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 Pt saw Dr.Dasher ( derm ) in Sept 2020 for various skin lesions due to sun damage.. was diagnosed with actininc keratosis  Over forehead, scalp, cheek and treated with 5 fluoracil topical cream. And also had liquid nitrogen. 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 She then underwent excision of the melanoma in situ on 08/29/19- pathology  came back as clear margins. The surgical site on 09/12/19 had some slime and culture sent and she was started on Doxycycline. It grew pseudomonas and MSSA. During her visit on 10/17/19 violaceous nodules were seen and a punch biopsy taken for HPE . 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 On 10/23/19 another punch biopsy was sent for AFB culture and she was placed on minocycline and ciprofloxacin but patient was reghtly asked not to take it and wait for culture report after dermatologist discussed with 2 ID physicians ( including me) The culture resulted as mycobacterium chelonae on 11/19/19 and I was asked to see the patient. I saw her on 1/21 and on 1/26 we started treatment with azithromycin and minocycline- In her follow up visit on 2/25 she said that she had stopped the meds for a few days because of severe GI symptoms and also dizziness and headche. It was likely minocycline sideeffect and I asked her to continue azithromycin alone as it was a localized SSI. She is here for follow up and seems to be doing well- no nausea, pain abdomen , dizziness, headache or fatigue. Tolerating  azithromycin well  She is followed by Dr.Kasa for copd   Past Medical History:  Diagnosis Date  . 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 Spontaneous pneumothorax   CAD with stents HTN   Past Surgical History:  Procedure Laterality Date  . CHEST TUBE INSERTION     for spontaneous pneumo  . stents  2006   LAD  Dr. Alben Spittle    Social History   Socioeconomic History  . Marital status: Married    Spouse name: Not on file  . Number of children: Not on file  . Years of education: Not on file  . Highest education level: Not on file  Occupational History  . Not on file  Tobacco Use  . Smoking status: Former Smoker    Packs/day: 0.50    Years: 15.00    Pack years: 7.50    Types: Cigarettes    Quit date: 2006    Years since quitting: 15.2  . Smokeless tobacco: Never Used  Substance and Sexual Activity  . Alcohol use: Yes    Alcohol/week: 4.0 standard drinks    Types: 4 Cans of beer per week    Comment: socially  . Drug use: No  . Sexual activity: Not on file  Other Topics Concern  . Not on file  Social History Narrative  . Not on file   Social Determinants of Health   Financial Resource Strain:   . Difficulty of Paying Living Expenses:  Food Insecurity:   . Worried About Charity fundraiser in the Last Year:   . Arboriculturist in the Last Year:   Transportation Needs:   . Film/video editor (Medical):   Marland Kitchen Lack of Transportation (Non-Medical):   Physical Activity:   . Days of Exercise per Week:   . Minutes of Exercise per Session:   Stress:   . Feeling of Stress :   Social Connections:   . Frequency of Communication with Friends and Family:   . Frequency of Social Gatherings with Friends and Family:   . Attends Religious Services:   . Active Member of Clubs or Organizations:   . Attends Archivist Meetings:   Marland Kitchen Marital Status:   Intimate Partner Violence:   . Fear of Current or Ex-Partner:   . Emotionally Abused:   Marland Kitchen Physically Abused:   . Sexually  Abused:     Family History  Problem Relation Age of Onset  . Cancer Mother        breast  . Heart disease Father   . Heart disease Brother   . Heart disease Paternal Aunt    No Known Allergies  ? Current Outpatient Medications  Medication Sig Dispense Refill  . albuterol (VENTOLIN HFA) 108 (90 Base) MCG/ACT inhaler Inhale 2 puffs into the lungs every 4 (four) hours as needed for wheezing or shortness of breath. 1 g 6  . aspirin EC 81 MG tablet Take by mouth.    Marland Kitchen atorvastatin (LIPITOR) 40 MG tablet TAKE 1 TABLET EVERY DAY    . azithromycin (ZITHROMAX) 500 MG tablet Take 1 tablet (500 mg total) by mouth daily. 30 tablet 1  . lisinopril-hydrochlorothiazide (ZESTORETIC) 20-12.5 MG tablet TAKE 1 TABLET EVERY DAY    . metoprolol succinate (TOPROL-XL) 100 MG 24 hr tablet TAKE 1 TABLET EVERY DAY    . nitroGLYCERIN (NITROSTAT) 0.4 MG SL tablet DISSOLVE 1 TABLET UNDER THE TONGUE EVERY 5 MINUTES AS NEEDED FOR CHEST PAIN, MAY TAKE UP TO 3 DOSES    . Tiotropium Bromide Monohydrate (SPIRIVA RESPIMAT) 1.25 MCG/ACT AERS Inhale 2 puffs into the lungs daily. 1 g 10   No current facility-administered medications for this visit.     Abtx:  Anti-infectives (From admission, onward)   None      REVIEW OF SYSTEMS:  Const: negative fever, negative chills, weight aro Eyes: negative diplopia or visual changes, negative eye pain ENT: negative coryza, negative sore throat Resp: negative cough,has dyspnea Cards: negative for chest pain, palpitations, lower extremity edema GU: negative for frequency, dysuria and hematuria GI:no  stomach aches, no diarrhea,no loss of appetite, no gas Skin:  rash leg Heme: negative for easy bruising and gum/nose bleeding MS: negative for myalgias, arthralgias, back pain and muscle weakness Neurolo:no  headaches, no dizziness, No vertigo, memory problems  Psych: negative for feelings of anxiety, depression  Endocrine: negative for thyroid, diabetes Allergy/Immunology-  negative for any medication or food allergies ?  Objective:  VITALS:  BP (!) 184/87 (BP Location: Left Arm, Patient Position: Sitting, Cuff Size: Normal)   Pulse 89   Resp 16   Ht 5' (1.524 m)   Wt 88 lb (39.9 kg)   SpO2 94%   BMI 17.19 kg/m  PHYSICAL EXAM:  General: Alert, cooperative, no distress, appears stated age. Thin bulit weighs 40 Kg Head: Normocephalic, without obvious abnormality, atraumatic. Eyes: Conjunctivae clear, anicteric sclerae. Pupils are equal ENT Nares normal. No drainage or sinus tenderness. Lips, mucosa, and  tongue normal. No Thrush Neck: Supple, symmetrical, no adenopathy, thyroid: non tender no carotid bruit and no JVD. Back: No CVA tenderness. Lungs: Clear to auscultation bilaterally. No Wheezing or Rhonchi. No rales. Heart: Regular rate and rhythm, no murmur, rub or gallop. Abdomen: Soft, non-tender,not distended. Bowel sounds normal. No masses Extremities: rt calf- violaceous nodules/ulceration over the surgical scar area 02/05/20    01/01/20    11/27/19  Skin: severly sun damaged skin Lymph: Cervical, supraclavicular normal. Neurologic: Grossly non-focal    Microbiology:      IMAGING RESULTS: none  ? Impression/Recommendation ? 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 But today the lesion seems to have more nodularity and slightly worse Will need to add linezolid or tedizolid to azithromycin- will prefer the latter as less DDI. She will call in with all her meds Will  check with her insurance for coverage   CAD with stents current meds -aspirin, metoprolol, zestoretic, atorvastatin  HTN- not well controlled- she will talk to her PCP ? ?Ex smoker Spontaneous Pneumothorax- COPD phenotype- but not on any meds- followed by Dr.Kasa on 01/20/20  Will follow up

## 2020-02-06 ENCOUNTER — Other Ambulatory Visit: Payer: Self-pay | Admitting: Infectious Diseases

## 2020-02-06 MED ORDER — PYRIDOXINE HCL 50 MG PO TABS: 50.0000 mg | ORAL_TABLET | Freq: Every day | ORAL | 1 refills | Status: DC

## 2020-02-06 MED ORDER — LINEZOLID 600 MG PO TABS: 600.0000 mg | ORAL_TABLET | ORAL | 1 refills | Status: DC

## 2020-02-06 NOTE — Progress Notes (Signed)
Spoke to the patient and sent a prescription for linezolid 600mg  once a day for 30 days Pyridoxine 50mg  was also sent Pt informed about side effects

## 2020-03-02 ENCOUNTER — Encounter: Payer: Self-pay | Admitting: Infectious Diseases

## 2020-03-02 ENCOUNTER — Other Ambulatory Visit
Admission: RE | Admit: 2020-03-02 | Discharge: 2020-03-02 | Disposition: A | Discharge status: Home / Self Care | Payer: Medicare PPO | Source: Ambulatory Visit | Attending: Infectious Diseases | Admitting: Infectious Diseases

## 2020-03-02 ENCOUNTER — Ambulatory Visit: Payer: Medicare PPO | Attending: Infectious Diseases | Admitting: Infectious Diseases

## 2020-03-02 ENCOUNTER — Other Ambulatory Visit: Payer: Self-pay

## 2020-03-02 VITALS — BP 136/88 | HR 94 | Temp 97.8°F | Resp 16 | Ht 60.0 in | Wt 88.0 lb

## 2020-03-02 DIAGNOSIS — J439 Emphysema, unspecified: Secondary | ICD-10-CM | POA: Insufficient documentation

## 2020-03-02 DIAGNOSIS — Z87891 Personal history of nicotine dependence: Secondary | ICD-10-CM | POA: Diagnosis not present

## 2020-03-02 DIAGNOSIS — Z8249 Family history of ischemic heart disease and other diseases of the circulatory system: Secondary | ICD-10-CM | POA: Insufficient documentation

## 2020-03-02 DIAGNOSIS — Z79899 Other long term (current) drug therapy: Secondary | ICD-10-CM | POA: Diagnosis not present

## 2020-03-02 DIAGNOSIS — Z955 Presence of coronary angioplasty implant and graft: Secondary | ICD-10-CM | POA: Insufficient documentation

## 2020-03-02 DIAGNOSIS — I252 Old myocardial infarction: Secondary | ICD-10-CM | POA: Insufficient documentation

## 2020-03-02 DIAGNOSIS — A311 Cutaneous mycobacterial infection: Secondary | ICD-10-CM | POA: Diagnosis present

## 2020-03-02 DIAGNOSIS — I10 Essential (primary) hypertension: Secondary | ICD-10-CM | POA: Insufficient documentation

## 2020-03-02 DIAGNOSIS — I2510 Atherosclerotic heart disease of native coronary artery without angina pectoris: Secondary | ICD-10-CM | POA: Diagnosis not present

## 2020-03-02 DIAGNOSIS — A318 Other mycobacterial infections: Secondary | ICD-10-CM | POA: Diagnosis present

## 2020-03-02 DIAGNOSIS — Z86007 Personal history of in-situ neoplasm of skin: Secondary | ICD-10-CM | POA: Diagnosis not present

## 2020-03-02 DIAGNOSIS — Z803 Family history of malignant neoplasm of breast: Secondary | ICD-10-CM | POA: Insufficient documentation

## 2020-03-02 DIAGNOSIS — Z7982 Long term (current) use of aspirin: Secondary | ICD-10-CM | POA: Insufficient documentation

## 2020-03-02 LAB — COMPREHENSIVE METABOLIC PANEL
ALT: 78 U/L — ABNORMAL HIGH (ref 0–44)
AST: 61 U/L — ABNORMAL HIGH (ref 15–41)
Albumin: 4.3 g/dL (ref 3.5–5.0)
Alkaline Phosphatase: 87 U/L (ref 38–126)
Anion gap: 9 (ref 5–15)
BUN: 30 mg/dL — ABNORMAL HIGH (ref 8–23)
CO2: 29 mmol/L (ref 22–32)
Calcium: 9.6 mg/dL (ref 8.9–10.3)
Chloride: 102 mmol/L (ref 98–111)
Creatinine, Ser: 0.91 mg/dL (ref 0.44–1.00)
GFR calc Af Amer: >60 mL/min (ref >60)
GFR calc non Af Amer: >60 mL/min (ref >60)
Glucose, Bld: 102 mg/dL — ABNORMAL HIGH (ref 70–99)
Potassium: 4.1 mmol/L (ref 3.5–5.1)
Sodium: 140 mmol/L (ref 135–145)
Total Bilirubin: 0.8 mg/dL (ref 0.3–1.2)
Total Protein: 7.2 g/dL (ref 6.5–8.1)

## 2020-03-02 LAB — CBC WITH DIFFERENTIAL/PLATELET
Abs Immature Granulocytes: 0.01 10*3/uL (ref 0.00–0.07)
Basophils Absolute: 0 10*3/uL (ref 0.0–0.1)
Basophils Relative: 0 %
Eosinophils Absolute: 0 10*3/uL (ref 0.0–0.5)
Eosinophils Relative: 1 %
HCT: 35.5 % — ABNORMAL LOW (ref 36.0–46.0)
Hemoglobin: 11.7 g/dL — ABNORMAL LOW (ref 12.0–15.0)
Immature Granulocytes: 0 %
Lymphocytes Relative: 29 %
Lymphs Abs: 1.3 10*3/uL (ref 0.7–4.0)
MCH: 32.1 pg (ref 26.0–34.0)
MCHC: 33 g/dL (ref 30.0–36.0)
MCV: 97.3 fL (ref 80.0–100.0)
Monocytes Absolute: 0.5 10*3/uL (ref 0.1–1.0)
Monocytes Relative: 12 %
Neutro Abs: 2.6 10*3/uL (ref 1.7–7.7)
Neutrophils Relative %: 58 %
Platelets: 120 10*3/uL — ABNORMAL LOW (ref 150–400)
RBC: 3.65 MIL/uL — ABNORMAL LOW (ref 3.87–5.11)
RDW: 12.6 % (ref 11.5–15.5)
WBC: 4.5 10*3/uL (ref 4.0–10.5)
nRBC: 0 % (ref 0.0–0.2)

## 2020-03-02 LAB — LACTIC ACID, PLASMA: Lactic Acid, Venous: 1.4 mmol/L (ref 0.5–1.9)

## 2020-03-02 NOTE — Progress Notes (Signed)
NAME: Erica Cobb  DOB: 17-Oct-1941  MRN: [id number]  Date/Time: 03/02/2020 11:00 AM  REQUESTING PROVIDER Subjective:  Follow up  for mycobacterium chelonae-   Follow up  Pt here after starting linezolid 3 weeks ago- she is also on azithromycin- she was doing well until this Saturday when her baseline loose stool got worse and runny. She was nauseous- so she stopped antibiotics on Sunday She has no fever, rash, vomiting, occasional abdominal ache, appetite fair. ? 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 Pt saw Dr.Dasher ( derm ) in Sept 2020 for various skin lesions due to sun damage.. was diagnosed with actininc keratosis  Over forehead, scalp, cheek and treated with 5 fluoracil topical cream. And also had liquid nitrogen. 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 She then underwent excision of the melanoma in situ on 08/29/19- pathology  came back as clear margins. The surgical site on 09/12/19 had some slime and culture sent and she was started on Doxycycline. It grew pseudomonas and MSSA. During her visit on 10/17/19 violaceous nodules were seen and a punch biopsy taken for HPE . 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 On 10/23/19 another punch biopsy was sent for AFB culture and she was placed on minocycline and ciprofloxacin but patient was reghtly asked not to take it and wait for culture report after dermatologist discussed with 2 ID physicians ( including me) The culture resulted as mycobacterium chelonae on 11/19/19 and I was asked to see the patient. I saw her on 1/21 and on 1/26 we started treatment with azithromycin and minocycline- In her follow up visit on 2/25 she said that she had stopped the meds for  a few days because of severe GI symptoms and also dizziness and headche. It was likely minocycline sideeffect and I asked her to continue azithromycin alone as it was a localized SSI. In April 2021 as the lesion was getting worse linezolid was added and she was tolerating both azithromycin and linezolid for 3 weeks and last weekend started having GI symptoms  Past Medical History:  Diagnosis Date  . 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 Spontaneous pneumothorax   CAD with stents HTN   Past Surgical History:  Procedure Laterality Date  . CHEST TUBE INSERTION     for spontaneous pneumo  . stents  2006   LAD  Dr. Alben Spittle    Social History   Socioeconomic History  . Marital status: Married    Spouse name: Not on file  . Number of children: Not on file  . Years of education: Not on file  . Highest education level: Not on file  Occupational History  . Not on file  Tobacco Use  . Smoking status: Former Smoker    Packs/day: 0.50    Years: 15.00    Pack years: 7.50    Types: Cigarettes    Quit date: 2006    Years since quitting: 15.3  . Smokeless tobacco: Never Used  Substance and Sexual Activity  . Alcohol use: Yes    Alcohol/week: 4.0 standard drinks    Types: 4 Cans of beer per week    Comment: socially  .  Drug use: No  . Sexual activity: Not on file  Other Topics Concern  . Not on file  Social History Narrative  . Not on file   Social Determinants of Health   Financial Resource Strain:   . Difficulty of Paying Living Expenses:   Food Insecurity:   . Worried About Charity fundraiser in the Last Year:   . Arboriculturist in the Last Year:   Transportation Needs:   . Film/video editor (Medical):   Marland Kitchen Lack of Transportation (Non-Medical):   Physical Activity:   . Days of Exercise per Week:   . Minutes of Exercise per Session:   Stress:   . Feeling of Stress :   Social Connections:   . Frequency of Communication with Friends and Family:   . Frequency of Social  Gatherings with Friends and Family:   . Attends Religious Services:   . Active Member of Clubs or Organizations:   . Attends Archivist Meetings:   Marland Kitchen Marital Status:   Intimate Partner Violence:   . Fear of Current or Ex-Partner:   . Emotionally Abused:   Marland Kitchen Physically Abused:   . Sexually Abused:     Family History  Problem Relation Age of Onset  . Cancer Mother        breast  . Heart disease Father   . Heart disease Brother   . Heart disease Paternal Aunt    No Known Allergies  ? Current Outpatient Medications  Medication Sig Dispense Refill  . albuterol (VENTOLIN HFA) 108 (90 Base) MCG/ACT inhaler Inhale 2 puffs into the lungs every 4 (four) hours as needed for wheezing or shortness of breath. 1 g 6  . aspirin EC 81 MG tablet Take by mouth.    Marland Kitchen atorvastatin (LIPITOR) 40 MG tablet TAKE 1 TABLET EVERY DAY    . azithromycin (ZITHROMAX) 500 MG tablet Take 1 tablet (500 mg total) by mouth daily. 30 tablet 1  . lisinopril-hydrochlorothiazide (ZESTORETIC) 20-12.5 MG tablet TAKE 1 TABLET EVERY DAY    . metoprolol succinate (TOPROL-XL) 100 MG 24 hr tablet TAKE 1 TABLET EVERY DAY    . nitroGLYCERIN (NITROSTAT) 0.4 MG SL tablet DISSOLVE 1 TABLET UNDER THE TONGUE EVERY 5 MINUTES AS NEEDED FOR CHEST PAIN, MAY TAKE UP TO 3 DOSES    . pyridOXINE (B-6) 50 MG tablet Take 1 tablet (50 mg total) by mouth daily. 30 tablet 1  . Tiotropium Bromide Monohydrate (SPIRIVA RESPIMAT) 1.25 MCG/ACT AERS Inhale 2 puffs into the lungs daily. 1 g 10  . linezolid (ZYVOX) 600 MG tablet Take 1 tablet (600 mg total) by mouth daily. (Patient not taking: Reported on 03/02/2020) 30 tablet 1   No current facility-administered medications for this visit.     Abtx:  Anti-infectives (From admission, onward)   None      REVIEW OF SYSTEMS:  Const: negative fever, negative chills, weight aro Eyes: negative diplopia or visual changes, negative eye pain ENT: negative coryza, negative sore throat Resp:  negative cough,has dyspnea Cards: negative for chest pain, palpitations, lower extremity edema GU: negative for frequency, dysuria and hematuria GI: as above  skin:  rash leg Heme: negative for easy bruising and gum/nose bleeding MS: negative for myalgias, arthralgias, back pain and muscle weakness Neurolo:no  headaches, no dizziness, No vertigo, memory problems  Psych: negative for feelings of anxiety, depression  Endocrine: negative for thyroid, diabetes Allergy/Immunology- negative for any medication or food allergies ?  Objective:  VITALS:  BP 136/88   Pulse 94   Temp 97.8 F (36.6 C)   Resp 16   Ht 5' (1.524 m)   Wt 88 lb (39.9 kg)   SpO2 94%   BMI 17.19 kg/m  PHYSICAL EXAM:  General: Alert, cooperative, no distress, appears stated age. Thin bulit weighs 40 Kg Head: Normocephalic, without obvious abnormality, atraumatic. Eyes: Conjunctivae clear, anicteric sclerae. Pupils are equal ENT Nares normal. No drainage or sinus tenderness. Lips, mucosa, and tongue normal. No Thrush Neck: Supple, symmetrical, no adenopathy, thyroid: non tender no carotid bruit and no JVD. Back: No CVA tenderness. Lungs: Clear to auscultation bilaterally. No Wheezing or Rhonchi. No rales. Heart: Regular rate and rhythm, no murmur, rub or gallop. Abdomen: Soft, non-tender,not distended. Bowel sounds normal. No masses Extremities: rt calf- violaceous nodules/ulceration over the surgical scar area much improved   4/27      02/05/20    01/01/20    11/27/19  Skin: severly sun damaged skin Lymph: Cervical, supraclavicular normal. Neurologic: Grossly non-focal    Microbiology:      IMAGING RESULTS: none  ? Impression/Recommendation ? 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 On 4/1 the lesion was flaring up and we added linezolid 600mg  once a day to azithromycin  Now she has severe diarrhea after 3 weeks on  this combo she stopped both antibiotics on Sunday 02/29/20 Will check cdiff Will check labs today ( CBC/CMP/lactate/  Will then decide whether we can reduce azithromycin to 250mg  once a day along with linezolid 600mg  once a day Continue probiotic  CAD with stents current meds -aspirin, metoprolol, zestoretic, atorvastatin  HTN- not well controlled- she will talk to her PCP ? ?Ex smoker Spontaneous Pneumothorax- COPD phenotype- but not on any meds- followed by Dr.Kasa on 01/20/20  Will let her know after the lab results of the plan Will follow up on 4 weeks  PS Platelet 120, WBC 4.5, hemoglobin 11.7, creatinine 0.91, lactate 1.3, bicarbonate 29 LFTs: AST 61, ALT 78. Last patient to hold off on atorvastatin. Once C. difficile result is available we will decide whether to continue the antibiotics.

## 2020-03-02 NOTE — Patient Instructions (Addendum)
You are currently here for Mycobacterium chelonae skin infection Started antibiotic on 12/02/19-minocycline and azithromycin- you have had 2 episodes of severe GI sid eeffects and you stopped  minocylcine on feb22nd. I spoek to you on feb 25th and we decided to continue only azithromycin as the lesions/infection was very localized. Then on 4/1 as the infection at the calf site looked worse I started linezolid along with azithromycin. 3 weeks after starting this combination you have severe diarrhea and you have stopped meds on 02/29/20. Today 03/02/20 we will do some blood work and also stool test for cdiff. If they look good then we can restart the same medicine but give I/2 the dose of azithromycin - 250mg  . If no cdiff we can give a medication to control your diarrhea as well.

## 2020-03-03 ENCOUNTER — Other Ambulatory Visit
Admission: RE | Admit: 2020-03-03 | Discharge: 2020-03-03 | Disposition: A | Discharge status: Home / Self Care | Payer: Medicare PPO | Source: Ambulatory Visit | Attending: Infectious Diseases | Admitting: Infectious Diseases

## 2020-03-03 DIAGNOSIS — A311 Cutaneous mycobacterial infection: Secondary | ICD-10-CM | POA: Diagnosis present

## 2020-03-03 LAB — C DIFFICILE QUICK SCREEN W PCR REFLEX
C Diff antigen: NEGATIVE
C Diff interpretation: NOT DETECTED
C Diff toxin: NEGATIVE

## 2020-03-04 ENCOUNTER — Ambulatory Visit: Payer: Medicare PPO | Admitting: Infectious Diseases

## 2020-03-11 ENCOUNTER — Other Ambulatory Visit: Payer: Self-pay | Admitting: Infectious Diseases

## 2020-03-11 ENCOUNTER — Telehealth: Payer: Self-pay

## 2020-03-11 ENCOUNTER — Other Ambulatory Visit
Admission: RE | Admit: 2020-03-11 | Discharge: 2020-03-11 | Disposition: A | Discharge status: Home / Self Care | Payer: Medicare PPO | Attending: Infectious Diseases | Admitting: Infectious Diseases

## 2020-03-11 ENCOUNTER — Other Ambulatory Visit: Payer: Self-pay

## 2020-03-11 DIAGNOSIS — A311 Cutaneous mycobacterial infection: Secondary | ICD-10-CM | POA: Diagnosis present

## 2020-03-11 LAB — CBC WITH DIFFERENTIAL/PLATELET
Abs Immature Granulocytes: 0.01 10*3/uL (ref 0.00–0.07)
Basophils Absolute: 0 10*3/uL (ref 0.0–0.1)
Basophils Relative: 1 %
Eosinophils Absolute: 0 10*3/uL (ref 0.0–0.5)
Eosinophils Relative: 1 %
HCT: 33.7 % — ABNORMAL LOW (ref 36.0–46.0)
Hemoglobin: 11.2 g/dL — ABNORMAL LOW (ref 12.0–15.0)
Immature Granulocytes: 0 %
Lymphocytes Relative: 26 %
Lymphs Abs: 0.9 10*3/uL (ref 0.7–4.0)
MCH: 31.9 pg (ref 26.0–34.0)
MCHC: 33.2 g/dL (ref 30.0–36.0)
MCV: 96 fL (ref 80.0–100.0)
Monocytes Absolute: 0.3 10*3/uL (ref 0.1–1.0)
Monocytes Relative: 9 %
Neutro Abs: 2.1 10*3/uL (ref 1.7–7.7)
Neutrophils Relative %: 63 %
Platelets: 190 10*3/uL (ref 150–400)
RBC: 3.51 MIL/uL — ABNORMAL LOW (ref 3.87–5.11)
RDW: 13.9 % (ref 11.5–15.5)
WBC: 3.4 10*3/uL — ABNORMAL LOW (ref 4.0–10.5)
nRBC: 0 % (ref 0.0–0.2)

## 2020-03-11 LAB — COMPREHENSIVE METABOLIC PANEL
ALT: 50 U/L — ABNORMAL HIGH (ref 0–44)
AST: 35 U/L (ref 15–41)
Albumin: 4.1 g/dL (ref 3.5–5.0)
Alkaline Phosphatase: 74 U/L (ref 38–126)
Anion gap: 9 (ref 5–15)
BUN: 27 mg/dL — ABNORMAL HIGH (ref 8–23)
CO2: 29 mmol/L (ref 22–32)
Calcium: 9.5 mg/dL (ref 8.9–10.3)
Chloride: 102 mmol/L (ref 98–111)
Creatinine, Ser: 1.13 mg/dL — ABNORMAL HIGH (ref 0.44–1.00)
GFR calc Af Amer: 54 mL/min — ABNORMAL LOW (ref >60)
GFR calc non Af Amer: 46 mL/min — ABNORMAL LOW (ref >60)
Glucose, Bld: 132 mg/dL — ABNORMAL HIGH (ref 70–99)
Potassium: 4 mmol/L (ref 3.5–5.1)
Sodium: 140 mmol/L (ref 135–145)
Total Bilirubin: 0.9 mg/dL (ref 0.3–1.2)
Total Protein: 6.9 g/dL (ref 6.5–8.1)

## 2020-03-11 NOTE — Telephone Encounter (Signed)
Patient called to confirm orders yesterday for labs she will have drawn  today. She stated she is feeling well as she can be and will come to do labs this PM> Advised we will be in touch once results are in.

## 2020-03-15 ENCOUNTER — Telehealth: Payer: Self-pay | Admitting: Infectious Diseases

## 2020-03-15 NOTE — Telephone Encounter (Signed)
Pt called asking whether she should continue linezolid and azithromycin-and I told her yes for a month

## 2020-03-23 ENCOUNTER — Ambulatory Visit: Payer: Medicare PPO | Attending: Infectious Diseases | Admitting: Infectious Diseases

## 2020-03-23 ENCOUNTER — Other Ambulatory Visit
Admission: RE | Admit: 2020-03-23 | Discharge: 2020-03-23 | Disposition: A | Discharge status: Home / Self Care | Payer: Medicare PPO | Source: Ambulatory Visit | Attending: Infectious Diseases | Admitting: Infectious Diseases

## 2020-03-23 ENCOUNTER — Encounter: Payer: Self-pay | Admitting: Infectious Diseases

## 2020-03-23 VITALS — BP 139/88 | HR 74 | Temp 97.9°F | Resp 16 | Ht 60.0 in | Wt 87.0 lb

## 2020-03-23 DIAGNOSIS — Z86007 Personal history of in-situ neoplasm of skin: Secondary | ICD-10-CM | POA: Diagnosis not present

## 2020-03-23 DIAGNOSIS — J439 Emphysema, unspecified: Secondary | ICD-10-CM | POA: Diagnosis not present

## 2020-03-23 DIAGNOSIS — Z8249 Family history of ischemic heart disease and other diseases of the circulatory system: Secondary | ICD-10-CM | POA: Diagnosis not present

## 2020-03-23 DIAGNOSIS — I10 Essential (primary) hypertension: Secondary | ICD-10-CM | POA: Insufficient documentation

## 2020-03-23 DIAGNOSIS — I2510 Atherosclerotic heart disease of native coronary artery without angina pectoris: Secondary | ICD-10-CM | POA: Insufficient documentation

## 2020-03-23 DIAGNOSIS — T50905D Adverse effect of unspecified drugs, medicaments and biological substances, subsequent encounter: Secondary | ICD-10-CM | POA: Insufficient documentation

## 2020-03-23 DIAGNOSIS — Z79899 Other long term (current) drug therapy: Secondary | ICD-10-CM | POA: Diagnosis not present

## 2020-03-23 DIAGNOSIS — Z955 Presence of coronary angioplasty implant and graft: Secondary | ICD-10-CM | POA: Insufficient documentation

## 2020-03-23 DIAGNOSIS — D649 Anemia, unspecified: Secondary | ICD-10-CM | POA: Diagnosis not present

## 2020-03-23 DIAGNOSIS — Z803 Family history of malignant neoplasm of breast: Secondary | ICD-10-CM | POA: Insufficient documentation

## 2020-03-23 DIAGNOSIS — A311 Cutaneous mycobacterial infection: Secondary | ICD-10-CM

## 2020-03-23 DIAGNOSIS — Z7982 Long term (current) use of aspirin: Secondary | ICD-10-CM | POA: Diagnosis not present

## 2020-03-23 DIAGNOSIS — Z87891 Personal history of nicotine dependence: Secondary | ICD-10-CM | POA: Insufficient documentation

## 2020-03-23 DIAGNOSIS — A318 Other mycobacterial infections: Secondary | ICD-10-CM | POA: Diagnosis present

## 2020-03-23 DIAGNOSIS — A319 Mycobacterial infection, unspecified: Secondary | ICD-10-CM | POA: Insufficient documentation

## 2020-03-23 LAB — HEPATIC FUNCTION PANEL
ALT: 26 U/L (ref 0–44)
AST: 30 U/L (ref 15–41)
Albumin: 4.5 g/dL (ref 3.5–5.0)
Alkaline Phosphatase: 63 U/L (ref 38–126)
Bilirubin, Direct: <0.1 mg/dL (ref 0.0–0.2)
Total Bilirubin: 0.8 mg/dL (ref 0.3–1.2)
Total Protein: 7.4 g/dL (ref 6.5–8.1)

## 2020-03-23 LAB — CBC WITH DIFFERENTIAL/PLATELET
Abs Immature Granulocytes: 0.02 10*3/uL (ref 0.00–0.07)
Basophils Absolute: 0 10*3/uL (ref 0.0–0.1)
Basophils Relative: 0 %
Eosinophils Absolute: 0 10*3/uL (ref 0.0–0.5)
Eosinophils Relative: 0 %
HCT: 31 % — ABNORMAL LOW (ref 36.0–46.0)
Hemoglobin: 10.2 g/dL — ABNORMAL LOW (ref 12.0–15.0)
Immature Granulocytes: 0 %
Lymphocytes Relative: 22 %
Lymphs Abs: 1.4 10*3/uL (ref 0.7–4.0)
MCH: 31.8 pg (ref 26.0–34.0)
MCHC: 32.9 g/dL (ref 30.0–36.0)
MCV: 96.6 fL (ref 80.0–100.0)
Monocytes Absolute: 0.6 10*3/uL (ref 0.1–1.0)
Monocytes Relative: 10 %
Neutro Abs: 4.1 10*3/uL (ref 1.7–7.7)
Neutrophils Relative %: 68 %
Platelets: 128 10*3/uL — ABNORMAL LOW (ref 150–400)
RBC: 3.21 MIL/uL — ABNORMAL LOW (ref 3.87–5.11)
RDW: 13 % (ref 11.5–15.5)
WBC: 6.1 10*3/uL (ref 4.0–10.5)
nRBC: 0 % (ref 0.0–0.2)

## 2020-03-23 LAB — BASIC METABOLIC PANEL
Anion gap: 10 (ref 5–15)
BUN: 28 mg/dL — ABNORMAL HIGH (ref 8–23)
CO2: 28 mmol/L (ref 22–32)
Calcium: 10 mg/dL (ref 8.9–10.3)
Chloride: 100 mmol/L (ref 98–111)
Creatinine, Ser: 0.94 mg/dL (ref 0.44–1.00)
GFR calc Af Amer: >60 mL/min (ref >60)
GFR calc non Af Amer: 58 mL/min — ABNORMAL LOW (ref >60)
Glucose, Bld: 115 mg/dL — ABNORMAL HIGH (ref 70–99)
Potassium: 4.3 mmol/L (ref 3.5–5.1)
Sodium: 138 mmol/L (ref 135–145)

## 2020-03-23 NOTE — Patient Instructions (Signed)
You are here for follow up of the mycobacterial infection rt leg- you have been on antibiotics intermittently since Jan 2021 punctuated by diarrhea, fatigue, headaches . The lesion looks much better- your last regimen was linezolid 600mg  one tablet  + azithromycin 250mg  one tablet a day along with pyridoxine 50mg  . You stopped them on 03/19/20 because of side effects. Also your white count is moderate low- so we will do labs today. Will follow up in 4 weeks

## 2020-03-23 NOTE — Progress Notes (Signed)
NAME: Erica Cobb  DOB: 1941/07/23  MRN: [id number]  Date/Time: 03/23/2020 9:55 AM  REQUESTING PROVIDER Subjective:  Follow up  for mycobacterium chelonae skin infection-  Pt having side effects from antibiotics and unable  to tolerate it  Hence she is seeing me today She has been on treatment for mycobacterium chelonae skin/soft tissue infection following surgery since 12/01/19-  ? 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 Pt saw Dr.Dasher ( derm ) in Sept 2020 for various skin lesions due to sun damage.. was diagnosed with actininc keratosis  Over forehead, scalp, cheek and treated with 5 fluoracil topical cream. And also had liquid nitrogen. 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 She then underwent excision of the melanoma in situ on 08/29/19- pathology  came back as clear margins. The surgical site on 09/12/19 had some slime and culture sent and she was started on Doxycycline. It grew pseudomonas and MSSA. During her visit on 10/17/19 violaceous nodules were seen and a punch biopsy taken for HPE . 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 On 10/23/19 another punch biopsy was sent for AFB culture and she was placed on minocycline and ciprofloxacin but patient was reghtly asked not to take it and wait for culture report after dermatologist discussed with 2 ID physicians ( including me) The culture resulted as mycobacterium chelonae on 11/19/19 and I was asked to see the patient. I saw her on 1/21 and on 1/26 we started treatment with azithromycin and minocycline- In her follow up visit on 2/25 she said that she had stopped the meds for a few days because of severe GI symptoms and also dizziness and headche. It was  likely minocycline caused the the sideeffect and I asked her to continue azithromycin alone as it was a localized SSI. On 02/05/20,  I saw her and the lesion was getting worse hence  linezolid 600mg  once a day  was added to azithromycin 500mg  once a day on 02/06/20 along with pyridoxine 50mg  after explaining to her the side effetcs of linezolid and food to avoid . - she was some what tolerating both azithromycin and linezolid for 3 weeks and then started having GI symptoms, with diarrhea , nausea and stopped the medication on 02/28/20. She saw me on 03/02/20 and I sent labs and cdiff- stool cdiff was negative CBC on 03/02/20 showed WBC of 4.5, plt of 120 and Hb 11.7. AST was 61 and ST 78. Lactate was normal at 1.4.  asked her to hold atorvatstatin and restarted azithromycin at 250mg  and linezolid at the same dose of 600mg  , both once a day on 03/03/20  She is here today as she is unable to tolerate the meds and wanted to know whether there is another option. She stopped the antibiotics on 03/19/20 She normally weights 90 pound but ever since she has been on antibiotics she weighs 87 pounds and her appetite is poor, has poor taste in her mouth, has frequent soft stools, fatigue. She says she does not have the energy she had before She is able to do some of her activities- went to [place] last weekend to see her granddaughter. Is going to a ball game tomorrow.  Past Medical History:  Diagnosis Date  . 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 Spontaneous pneumothorax   CAD with stents HTN   Past Surgical History:  Procedure Laterality Date  . CHEST TUBE INSERTION     for spontaneous pneumo  . stents  2006   LAD  Dr. Alben Spittle    Social History   Socioeconomic History  . Marital status: Married    Spouse name: Not on file  . Number of children: Not on file  . Years of education: Not on file  . Highest education level: Not on file  Occupational History  . Not on file  Tobacco Use  . Smoking status:  Former Smoker    Packs/day: 0.50    Years: 15.00    Pack years: 7.50    Types: Cigarettes    Quit date: 2006    Years since quitting: 15.3  . Smokeless tobacco: Never Used  Substance and Sexual Activity  . Alcohol use: Yes    Alcohol/week: 4.0 standard drinks    Types: 4 Cans of beer per week    Comment: socially  . Drug use: No  . Sexual activity: Not on file  Other Topics Concern  . Not on file  Social History Narrative  . Not on file   Social Determinants of Health   Financial Resource Strain:   . Difficulty of Paying Living Expenses:   Food Insecurity:   . Worried About Programme researcher, broadcasting/film/video in the Last Year:   . Barista in the Last Year:   Transportation Needs:   . Freight forwarder (Medical):   Marland Kitchen Lack of Transportation (Non-Medical):   Physical Activity:   . Days of Exercise per Week:   . Minutes of Exercise per Session:   Stress:   . Feeling of Stress :   Social Connections:   . Frequency of Communication with Friends and Family:   . Frequency of Social Gatherings with Friends and Family:   . Attends Religious Services:   . Active Member of Clubs or Organizations:   . Attends Banker Meetings:   Marland Kitchen Marital Status:   Intimate Partner Violence:   . Fear of Current or Ex-Partner:   . Emotionally Abused:   Marland Kitchen Physically Abused:   . Sexually Abused:     Family History  Problem Relation Age of Onset  . Cancer Mother        breast  . Heart disease Father   . Heart disease Brother   . Heart disease Paternal Aunt    No Known Allergies  ? Current Outpatient Medications  Medication Sig Dispense Refill  . albuterol (VENTOLIN HFA) 108 (90 Base) MCG/ACT inhaler Inhale 2 puffs into the lungs every 4 (four) hours as needed for wheezing or shortness of breath. 1 g 6  . aspirin EC 81 MG tablet Take by mouth.    Marland Kitchen lisinopril-hydrochlorothiazide (ZESTORETIC) 20-12.5 MG tablet TAKE 1 TABLET EVERY DAY    . metoprolol succinate (TOPROL-XL) 100 MG  24 hr tablet TAKE 1 TABLET EVERY DAY    . nitroGLYCERIN (NITROSTAT) 0.4 MG SL tablet DISSOLVE 1 TABLET UNDER THE TONGUE EVERY 5 MINUTES AS NEEDED FOR CHEST PAIN, MAY TAKE UP TO 3 DOSES    . pyridOXINE (B-6) 50 MG tablet Take 1 tablet (50 mg total) by mouth daily. 30 tablet 1  . Tiotropium Bromide Monohydrate (SPIRIVA RESPIMAT) 1.25 MCG/ACT AERS Inhale 2 puffs into the lungs daily. 1  g 10  . azithromycin (ZITHROMAX) 500 MG tablet Take 1 tablet (500 mg total) by mouth daily. (Patient not taking: Reported on 03/23/2020) 30 tablet 1  . linezolid (ZYVOX) 600 MG tablet Take 1 tablet (600 mg total) by mouth daily. (Patient not taking: Reported on 03/02/2020) 30 tablet 1   No current facility-administered medications for this visit.     Abtx:  Anti-infectives (From admission, onward)   None      REVIEW OF SYSTEMS:  Const: negative fever, negative chills, weight loss of 3-5 pounds  Eyes: negative diplopia or visual changes, negative eye pain ENT: negative coryza, negative sore throat Resp: negative cough,has dyspnea Cards: negative for chest pain, palpitations, lower extremity edema GU: negative for frequency, dysuria and hematuria GI: as above  skin:  heling lesion leg Heme: negative for easy bruising and gum/nose bleeding MS: negative for myalgias, arthralgias, back pain and muscle weakness, generalized fatigue Neurolo:+  headaches, no dizziness, No vertigo, memory problems  Psych: negative for feelings of anxiety, depression  Endocrine: negative for thyroid, diabetes Allergy/Immunology- negative for any medication or food allergies ?  Objective:  VITALS:  BP 139/88   Pulse 74   Temp 97.9 F (36.6 C) (Temporal)   Resp 16   Ht 5' (1.524 m)   Wt 87 lb (39.5 kg)   SpO2 93%   BMI 16.99 kg/m  PHYSICAL EXAM:  General: Alert, cooperative, no distress, appears stated age. Thin bulit weighs 87 pounds  Head: Normocephalic, without obvious abnormality, atraumatic. Eyes: Conjunctivae  clear, anicteric sclerae. Pupils are equal ENT Nares normal. No drainage or sinus tenderness. Lips, mucosa, and tongue normal. No Thrush Neck: Supple, symmetrical, no adenopathy, thyroid: non tender no carotid bruit and no JVD. Back: No CVA tenderness. Lungs: Clear to auscultation bilaterally. No Wheezing or Rhonchi. No rales. Heart: Regular rate and rhythm, no murmur, rub or gallop. Abdomen: Soft, non-tender,not distended. Bowel sounds normal. No masses Extremities: rt calf- violaceous nodules/ulceration over the surgical scar area much improved  5/18     4/27      02/05/20    01/01/20    11/27/19  Skin: severly sun damaged skin Lymph: Cervical, supraclavicular normal. Neurologic: Grossly non-focal    Microbiology:      Labs           ? Impression/Recommendation ? 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  Current antibiotics are linezolid and azithromycin since 4/1 - she stopped it 02/28/20 and re started on 4/28 and stopped 5/14  Her infection is localized to the skin and soft tissue of her rt calf at the previous surgical site. Ideally 2-4 months of Rx would suffice. inher case it has been complicated by side effects leading to some intermittent therapy. Also she failed single antibiotic therapy with azithromycin Because of GI symptoms fatigue loss of appetite and taste Will not continue any more antibiotics. Watch te lesion which has gotten significantly better without antibiotics     Anemia : could be from linezolid and poor intake-   Low WBC has resolved Platelet is fluctuating  Linezolid can cause bone marrow suppression /pancytopenia . This  is inspite of  her dose being  half the regular dose ( that is used for MRSA  infections)  and she was taking pyridoxine 50mg   to mitigate the side effects.  Will repeat labs in a week   CAD with stents current meds -aspirin, metoprolol, zestoretic,   Atorvastatin on hold since she had mild transaminitis 2 weeks ago. This has resolved- so will restart atorvastatin in a few days once her appetite picks up    HTN- on lisinopril/hctz and metoprolol ? ?Ex smoker   Spontaneous Pneumothorax- COPD phenotype-followed by [facility]  Discussed with patient in detail Will follow her in 1 month   Todays labs reviewed and she will have a follow up labs in 1-2 week-

## 2020-03-25 ENCOUNTER — Telehealth: Payer: Self-pay

## 2020-03-25 NOTE — Telephone Encounter (Signed)
Patient feeling much better. Advised labs look good but did show some anemia. Will come next Thur to get repeat labs. Please order.

## 2020-03-31 ENCOUNTER — Other Ambulatory Visit: Payer: Self-pay | Admitting: Infectious Diseases

## 2020-03-31 DIAGNOSIS — T887XXA Unspecified adverse effect of drug or medicament, initial encounter: Secondary | ICD-10-CM

## 2020-04-01 ENCOUNTER — Other Ambulatory Visit
Admission: RE | Admit: 2020-04-01 | Discharge: 2020-04-01 | Disposition: A | Discharge status: Home / Self Care | Payer: Medicare PPO | Source: Ambulatory Visit | Attending: Infectious Diseases | Admitting: Infectious Diseases

## 2020-04-01 DIAGNOSIS — T887XXA Unspecified adverse effect of drug or medicament, initial encounter: Secondary | ICD-10-CM | POA: Insufficient documentation

## 2020-04-01 LAB — COMPREHENSIVE METABOLIC PANEL
ALT: 37 U/L (ref 0–44)
AST: 39 U/L (ref 15–41)
Albumin: 4.1 g/dL (ref 3.5–5.0)
Alkaline Phosphatase: 65 U/L (ref 38–126)
Anion gap: 10 (ref 5–15)
BUN: 22 mg/dL (ref 8–23)
CO2: 29 mmol/L (ref 22–32)
Calcium: 9.6 mg/dL (ref 8.9–10.3)
Chloride: 103 mmol/L (ref 98–111)
Creatinine, Ser: 1.04 mg/dL — ABNORMAL HIGH (ref 0.44–1.00)
GFR calc Af Amer: 59 mL/min — ABNORMAL LOW (ref >60)
GFR calc non Af Amer: 51 mL/min — ABNORMAL LOW (ref >60)
Glucose, Bld: 121 mg/dL — ABNORMAL HIGH (ref 70–99)
Potassium: 4 mmol/L (ref 3.5–5.1)
Sodium: 142 mmol/L (ref 135–145)
Total Bilirubin: 0.7 mg/dL (ref 0.3–1.2)
Total Protein: 6.6 g/dL (ref 6.5–8.1)

## 2020-04-01 LAB — CBC WITH DIFFERENTIAL/PLATELET
Abs Immature Granulocytes: 0.01 10*3/uL (ref 0.00–0.07)
Basophils Absolute: 0 10*3/uL (ref 0.0–0.1)
Basophils Relative: 1 %
Eosinophils Absolute: 0.1 10*3/uL (ref 0.0–0.5)
Eosinophils Relative: 2 %
HCT: 31.8 % — ABNORMAL LOW (ref 36.0–46.0)
Hemoglobin: 10.2 g/dL — ABNORMAL LOW (ref 12.0–15.0)
Immature Granulocytes: 0 %
Lymphocytes Relative: 22 %
Lymphs Abs: 0.9 10*3/uL (ref 0.7–4.0)
MCH: 32.1 pg (ref 26.0–34.0)
MCHC: 32.1 g/dL (ref 30.0–36.0)
MCV: 100 fL (ref 80.0–100.0)
Monocytes Absolute: 0.3 10*3/uL (ref 0.1–1.0)
Monocytes Relative: 6 %
Neutro Abs: 2.9 10*3/uL (ref 1.7–7.7)
Neutrophils Relative %: 69 %
Platelets: 252 10*3/uL (ref 150–400)
RBC: 3.18 MIL/uL — ABNORMAL LOW (ref 3.87–5.11)
RDW: 15.7 % — ABNORMAL HIGH (ref 11.5–15.5)
WBC: 4.2 10*3/uL (ref 4.0–10.5)
nRBC: 0 % (ref 0.0–0.2)

## 2020-04-07 ENCOUNTER — Other Ambulatory Visit: Payer: Self-pay

## 2020-04-07 ENCOUNTER — Encounter: Payer: Self-pay | Admitting: Internal Medicine

## 2020-04-07 ENCOUNTER — Ambulatory Visit: Payer: Medicare PPO | Admitting: Internal Medicine

## 2020-04-07 VITALS — BP 110/62 | HR 65 | Temp 97.1°F | Ht 60.0 in | Wt 88.4 lb

## 2020-04-07 DIAGNOSIS — J449 Chronic obstructive pulmonary disease, unspecified: Secondary | ICD-10-CM | POA: Diagnosis not present

## 2020-04-07 NOTE — Progress Notes (Signed)
[facility] [place] [facility]monary Medicine Consultation      Date: 04/07/2020,   MRN# [id number] Erica Cobb [birth year]     AdmissionWeight: 88 lb 6.4 oz (40.1 kg)                 CurrentWeight: 88 lb 6.4 oz (40.1 kg) Erica Cobb is a 79 y.o. old female seen in consultation for COPD at the request of Dr. Graciela Husbands.  Previous History 79 yo female seen today for chronic SOB/DOE In 07/2016 she had been dx with Spontaneous Rt sided Pneumothorax s/p chest tube-this happened at [facility] 5 days later, patient had acute SOB again and  on 10/5 patient had another spontaneous RT sided PTX was admitted to [facility] for further evaluation Chest tube placed RT sided, noted to have lung expansion. Dr Thelma Barge was consulted and patient underwent Pleurodesis with doxycycline Patient was a former smoker, 1 ppd for 40 years quit 11 years ago s/p MI with stents, no previous PTX or pneumonia history Previous office spirometry showed 47% predicted 48% predicted for FEV1 which suggests severe obstructive airways disease    Previous office spirometry shows ratio 47% predicted   CHIEF COMPLAINT:   Follow-up COPD   HISTORY OF PRESENT ILLNESS   Chronic shortness of breath is stable over the last several months Dyspnea on exertion seems to be improved with Spiriva daily  No exacerbation at this time No evidence of heart failure at this time No evidence or signs of infection at this time No respiratory distress No fevers, chills, nausea, vomiting, diarrhea No evidence of lower extremity edema No evidence hemoptysis  Patient refused nocturnal hypoxia assessment in the past     Current Medication:  Current Outpatient Medications:  .  albuterol (VENTOLIN HFA) 108 (90 Base) MCG/ACT inhaler, Inhale 2 puffs into the lungs every 4 (four) hours as needed for wheezing or shortness of breath., Disp: 1 g, Rfl: 6 .  aspirin EC 81 MG tablet, Take by mouth., Disp: , Rfl:  .  linezolid (ZYVOX) 600 MG tablet, Take 1 tablet (600  mg total) by mouth daily., Disp: 30 tablet, Rfl: 1 .  lisinopril-hydrochlorothiazide (ZESTORETIC) 20-12.5 MG tablet, TAKE 1 TABLET EVERY DAY, Disp: , Rfl:  .  metoprolol succinate (TOPROL-XL) 100 MG 24 hr tablet, TAKE 1 TABLET EVERY DAY, Disp: , Rfl:  .  nitroGLYCERIN (NITROSTAT) 0.4 MG SL tablet, DISSOLVE 1 TABLET UNDER THE TONGUE EVERY 5 MINUTES AS NEEDED FOR CHEST PAIN, MAY TAKE UP TO 3 DOSES, Disp: , Rfl:  .  pyridOXINE (B-6) 50 MG tablet, Take 1 tablet (50 mg total) by mouth daily., Disp: 30 tablet, Rfl: 1 .  Tiotropium Bromide Monohydrate (SPIRIVA RESPIMAT) 1.25 MCG/ACT AERS, Inhale 2 puffs into the lungs daily., Disp: 1 g, Rfl: 10 .  azithromycin (ZITHROMAX) 500 MG tablet, Take 1 tablet (500 mg total) by mouth daily. (Patient not taking: Reported on 04/07/2020), Disp: 30 tablet, Rfl: 1    ALLERGIES   Patient has no known allergies.     REVIEW OF SYSTEMS   Review of Systems  Constitutional: Negative for chills, diaphoresis, fever, malaise/fatigue and weight loss.  HENT: Negative for congestion and hearing loss.   Eyes: Negative for blurred vision and double vision.  Respiratory: Negative for cough, hemoptysis, sputum production, shortness of breath and wheezing.   Cardiovascular: Negative for chest pain, palpitations, orthopnea and leg swelling.  Gastrointestinal: Negative for abdominal pain, heartburn, nausea and vomiting.  Musculoskeletal: Negative for neck pain.  Skin: Negative for rash.  Neurological: Negative for weakness.  All other systems reviewed and are negative.  BP 110/62 (BP Location: Left Arm, Cuff Size: Normal)   Pulse 65   Temp (!) 97.1 F (36.2 C) (Oral)   Ht 5' (1.524 m)   Wt 88 lb 6.4 oz (40.1 kg)   SpO2 97%   BMI 17.26 kg/m     PHYSICAL EXAM  Physical Exam  Constitutional: She is oriented to person, place, and time. She appears well-developed and well-nourished. No distress.  Cardiovascular: Normal rate, regular rhythm and normal heart sounds.   No murmur heard. Pulmonary/Chest: Effort normal and breath sounds normal. No stridor. No respiratory distress. She has no wheezes.  Musculoskeletal:        General: No edema. Normal range of motion.     Cervical back: Neck supple.  Neurological: She is alert and oriented to person, place, and time. No cranial nerve deficit.  Skin: Skin is warm. She is not diaphoretic.  Psychiatric: She has a normal mood and affect.     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  The importance of social distancing was discussed today.  MEDICATION ADJUSTMENTS/LABS AND TESTS ORDERED:  CURRENT MEDICATIONS REVIEWED AT LENGTH WITH PATIENT TODAY Continue Spiriva as prescribed  Patient satisfied with Plan of action and management. All questions answered  Follow up 6 months  Total time spent 21 mins  Maretta Bees Patricia Pesa, M.D.  [facility]  Medical Director [place] Director [facility] Cardio-Pulmonary Department

## 2020-04-07 NOTE — Patient Instructions (Signed)
Continue Spiriva as prescribed Avoid second hand smoke exposure

## 2020-04-20 ENCOUNTER — Ambulatory Visit: Payer: Medicare PPO | Admitting: Infectious Diseases

## 2020-04-27 ENCOUNTER — Encounter: Payer: Self-pay | Admitting: Infectious Diseases

## 2020-04-27 ENCOUNTER — Ambulatory Visit: Payer: Medicare PPO | Attending: Infectious Diseases | Admitting: Infectious Diseases

## 2020-04-27 ENCOUNTER — Other Ambulatory Visit: Payer: Self-pay

## 2020-04-27 VITALS — BP 169/99 | HR 63 | Resp 16 | Ht 60.0 in | Wt 90.0 lb

## 2020-04-27 DIAGNOSIS — A311 Cutaneous mycobacterial infection: Secondary | ICD-10-CM | POA: Insufficient documentation

## 2020-04-27 DIAGNOSIS — I10 Essential (primary) hypertension: Secondary | ICD-10-CM | POA: Diagnosis not present

## 2020-04-27 DIAGNOSIS — I252 Old myocardial infarction: Secondary | ICD-10-CM | POA: Insufficient documentation

## 2020-04-27 DIAGNOSIS — Z8249 Family history of ischemic heart disease and other diseases of the circulatory system: Secondary | ICD-10-CM | POA: Diagnosis not present

## 2020-04-27 DIAGNOSIS — J449 Chronic obstructive pulmonary disease, unspecified: Secondary | ICD-10-CM | POA: Diagnosis not present

## 2020-04-27 DIAGNOSIS — I2510 Atherosclerotic heart disease of native coronary artery without angina pectoris: Secondary | ICD-10-CM | POA: Diagnosis not present

## 2020-04-27 DIAGNOSIS — Z87891 Personal history of nicotine dependence: Secondary | ICD-10-CM | POA: Diagnosis not present

## 2020-04-27 DIAGNOSIS — Z955 Presence of coronary angioplasty implant and graft: Secondary | ICD-10-CM | POA: Insufficient documentation

## 2020-04-27 DIAGNOSIS — D649 Anemia, unspecified: Secondary | ICD-10-CM | POA: Insufficient documentation

## 2020-04-27 DIAGNOSIS — Z79899 Other long term (current) drug therapy: Secondary | ICD-10-CM | POA: Diagnosis not present

## 2020-04-27 DIAGNOSIS — Z7982 Long term (current) use of aspirin: Secondary | ICD-10-CM | POA: Insufficient documentation

## 2020-04-27 NOTE — Progress Notes (Signed)
NAME: Erica Cobb  DOB: Oct 04, 1941  MRN: [id number]  Date/Time: 04/27/2020 10:36 AM  REQUESTING PROVIDER Subjective:  Follow up  for mycobacterium chelonae skin infection-  Pt was on treatment since 12/02/19 and was experiencing many sid eeffects including nausea, fatigue, poor appetite and weight loss and anemia- all antibiotics ( azithro/linezolid)  stopped on 03/19/20 Because of abnormal LFTS Lipitor was also on hold But the last labs from 04/01/20 showed normalization of transaminases-   She is doing much better now--more enery , has gained 3 pounds   Medical history? 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 Pt saw Dr.Dasher ( derm ) in Sept 2020 for various skin lesions due to sun damage.. was diagnosed with actininc keratosis  Over forehead, scalp, cheek and treated with 5 fluoracil topical cream. And also had liquid nitrogen. 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 She then underwent excision of the melanoma in situ on 08/29/19- pathology  came back as clear margins. The surgical site on 09/12/19 had some slime and culture sent and she was started on Doxycycline. It grew pseudomonas and MSSA. During her visit on 10/17/19 violaceous nodules were seen and a punch biopsy taken for HPE . 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 On 10/23/19 another punch biopsy was sent for AFB culture and she was placed on minocycline and ciprofloxacin but patient was reghtly asked not to take it and wait for culture report after dermatologist discussed with 2 ID physicians ( including me) The culture resulted as mycobacterium chelonae on 11/19/19 and I was asked to see the patient. I saw her on 1/21 and on 1/26 we started  treatment with azithromycin and minocycline- In her follow up visit on 2/25 she said that she had stopped the meds for a few days because of severe GI symptoms and also dizziness and headche. It was likely minocycline caused the the sideeffect and I asked her to continue azithromycin alone as it was a localized SSI. On 02/05/20,  I saw her and the lesion was getting worse hence  linezolid 600mg  once a day  was added to azithromycin 500mg  once a day on 02/06/20 along with pyridoxine 50mg  after explaining to her the side effetcs of linezolid and food to avoid . - she was some what tolerating both azithromycin and linezolid for 3 weeks and then started having GI symptoms, with diarrhea , nausea and stopped the medication on 02/28/20. She saw me on 03/02/20 and I sent labs and cdiff- stool cdiff was negative CBC on 03/02/20 showed WBC of 4.5, plt of 120 and Hb 11.7. AST was 61 and ST 78. Lactate was normal at 1.4.  asked her to hold atorvatstatin and restarted azithromycin at 250mg  and linezolid at the same dose of 600mg  , both once a day on 03/03/20. Stopped both meds on 03/19/20      Past Medical History:  Diagnosis Date  . 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 Spontaneous pneumothorax   CAD with stents HTN   Past Surgical History:  Procedure Laterality Date  . CHEST TUBE INSERTION     for spontaneous pneumo  . stents  2006  LAD  Dr. Alben Spittle    Social History   Socioeconomic History  . Marital status: Married    Spouse name: Not on file  . Number of children: Not on file  . Years of education: Not on file  . Highest education level: Not on file  Occupational History  . Not on file  Tobacco Use  . Smoking status: Former Smoker    Packs/day: 0.50    Years: 15.00    Pack years: 7.50    Types: Cigarettes    Quit date: 2006    Years since quitting: 15.4  . Smokeless tobacco: Never Used  Vaping Use  . Vaping Use: Never used  Substance and Sexual Activity  . Alcohol use: Yes    Alcohol/week: 4.0  standard drinks    Types: 4 Cans of beer per week    Comment: socially  . Drug use: No  . Sexual activity: Not on file  Other Topics Concern  . Not on file  Social History Narrative  . Not on file   Social Determinants of Health   Financial Resource Strain:   . Difficulty of Paying Living Expenses:   Food Insecurity:   . Worried About Programme researcher, broadcasting/film/video in the Last Year:   . Barista in the Last Year:   Transportation Needs:   . Freight forwarder (Medical):   Marland Kitchen Lack of Transportation (Non-Medical):   Physical Activity:   . Days of Exercise per Week:   . Minutes of Exercise per Session:   Stress:   . Feeling of Stress :   Social Connections:   . Frequency of Communication with Friends and Family:   . Frequency of Social Gatherings with Friends and Family:   . Attends Religious Services:   . Active Member of Clubs or Organizations:   . Attends Banker Meetings:   Marland Kitchen Marital Status:   Intimate Partner Violence:   . Fear of Current or Ex-Partner:   . Emotionally Abused:   Marland Kitchen Physically Abused:   . Sexually Abused:     Family History  Problem Relation Age of Onset  . Cancer Mother        breast  . Heart disease Father   . Heart disease Brother   . Heart disease Paternal Aunt    No Known Allergies  ? Current Outpatient Medications  Medication Sig Dispense Refill  . aspirin EC 81 MG tablet Take by mouth.    Marland Kitchen lisinopril-hydrochlorothiazide (ZESTORETIC) 20-12.5 MG tablet TAKE 1 TABLET EVERY DAY    . metoprolol succinate (TOPROL-XL) 100 MG 24 hr tablet TAKE 1 TABLET EVERY DAY    . nitroGLYCERIN (NITROSTAT) 0.4 MG SL tablet DISSOLVE 1 TABLET UNDER THE TONGUE EVERY 5 MINUTES AS NEEDED FOR CHEST PAIN, MAY TAKE UP TO 3 DOSES    . albuterol (VENTOLIN HFA) 108 (90 Base) MCG/ACT inhaler Inhale 2 puffs into the lungs every 4 (four) hours as needed for wheezing or shortness of breath. 1 g 6   No current facility-administered medications for this visit.       Abtx:  Anti-infectives (From admission, onward)   None      REVIEW OF SYSTEMS:  Const: negative fever, negative chills, weight gain of 3 pounds  Eyes: negative diplopia or visual changes, negative eye pain ENT: negative coryza, negative sore throat Resp: negative cough,has dyspnea Cards: negative for chest pain, palpitations, lower extremity edema GU: negative for frequency, dysuria and hematuria GI: as above  skin:  No new lesions Heme: negative for easy bruising and gum/nose bleeding MS: fatigue and weakness much improved'went for a vacation at [facility] last week Neurolo:No headaches, no dizziness, No vertigo, memory problems  Psych: negative for feelings of anxiety, depression  Endocrine: negative for thyroid, diabetes Allergy/Immunology- negative for any medication or food allergies ?  Objective:  VITALS:  BP (!) 169/99 (BP Location: Right Arm, Patient Position: Sitting, Cuff Size: Normal)   Pulse 63   Resp 16   Ht 5' (1.524 m)   Wt 90 lb (40.8 kg)   SpO2 98%   BMI 17.58 kg/m  PHYSICAL EXAM:  General: Alert, cooperative, no distress,   Head: Normocephalic, without obvious abnormality, atraumatic. Eyes: Conjunctivae clear, anicteric sclerae. Pupils are equal ENT Nares normal. No drainage or sinus tenderness. Lips, mucosa, and tongue normal. No Thrush Neck: Supple, symmetrical, no adenopathy, thyroid: non tender no carotid bruit and no JVD. Back: No CVA tenderness. Lungs: Clear to auscultation bilaterally. No Wheezing or Rhonchi. No rales. Heart: Regular rate and rhythm, no murmur, rub or gallop. Abdomen: Soft, non-tender,not distended. Bowel sounds normal. No masses Extremities: rt calf- hyperpigmentation over the scar- minimal nodularity on palpation of the lower aspect much improved overall  04/27/20    5/18     4/27      02/05/20    01/01/20    11/27/19  Skin: severly sun damaged skin Lymph: Cervical, supraclavicular  normal. Neurologic: Grossly non-focal    Microbiology:      Labs CBC Latest Ref Rng & Units 04/01/2020 03/23/2020 03/11/2020  WBC 4.0 - 10.5 K/uL 4.2 6.1 3.4(L)  Hemoglobin 12.0 - 15.0 g/dL 10.2(L) 10.2(L) 11.2(L)  Hematocrit 36 - 46 % 31.8(L) 31.0(L) 33.7(L)  Platelets 150 - 400 K/uL 252 128(L) 190   CMP Latest Ref Rng & Units 04/01/2020 03/23/2020 03/11/2020  Glucose 70 - 99 mg/dL 121(H) 115(H) 132(H)  BUN 8 - 23 mg/dL 22 28(H) 27(H)  Creatinine 0.44 - 1.00 mg/dL 1.04(H) 0.94 1.13(H)  Sodium 135 - 145 mmol/L 142 138 140  Potassium 3.5 - 5.1 mmol/L 4.0 4.3 4.0  Chloride 98 - 111 mmol/L 103 100 102  CO2 22 - 32 mmol/L 29 28 29   Calcium 8.9 - 10.3 mg/dL 9.6 10.0 9.5  Total Protein 6.5 - 8.1 g/dL 6.6 7.4 6.9  Total Bilirubin 0.3 - 1.2 mg/dL 0.7 0.8 0.9  Alkaline Phos 38 - 126 U/L 65 63 74  AST 15 - 41 U/L 39 30 35  ALT 0 - 44 U/L 37 26 50(H)          ? Impression/Recommendation ? 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  Then she was on azithromycin alone for a month   linezolid and azithromycin since 4/1 - she stopped it 02/28/20 and re started on 4/28 and stopped 5/14 Infection healed and now has hyperpigmentaiton with minimal nodularity-  No more antibiotics    Anemia : with poor intake/antibiotics 04/01/20 - Hb 10.2  13.5 on 11/27/19 04/01/20 WBC/platelt normalized CAD with stents current meds -aspirin, metoprolol, zestoretic,   Atorvastatin on hold since she had mild transaminitis which has normalized - so  can restart  HTN- on lisinopril/hctz and metoprolol ? ?Ex smoker   Spontaneous Pneumothorax- COPD phenotype-followed by  Dr.KAsa  Discussed with patient in detail She is seeing her PCP next month and will have labs done a week before that. Will follow her in 3 months

## 2020-04-27 NOTE — Patient Instructions (Addendum)
You are here for follow up of the mycobacterium chelonae  infection of the leg. You took antibiotics for nearly 4 months and it was stopped last month because of numerous side effects- you are feeling better now and the lesion on the rt leg has healed with now just pigmentation  June 2021   Jan 2021     Will see you in 3 months Let Dr.Klein check your Hb and LFT next month

## 2020-08-18 ENCOUNTER — Emergency Department: Payer: Medicare PPO

## 2020-08-18 ENCOUNTER — Encounter: Payer: Self-pay | Admitting: Intensive Care

## 2020-08-18 ENCOUNTER — Other Ambulatory Visit: Payer: Self-pay

## 2020-08-18 ENCOUNTER — Observation Stay
Admission: EM | Admit: 2020-08-18 | Discharge: 2020-08-19 | Disposition: A | Discharge status: Home / Self Care | Payer: Medicare PPO | Attending: Internal Medicine | Admitting: Internal Medicine

## 2020-08-18 DIAGNOSIS — R079 Chest pain, unspecified: Secondary | ICD-10-CM | POA: Diagnosis present

## 2020-08-18 DIAGNOSIS — I2510 Atherosclerotic heart disease of native coronary artery without angina pectoris: Secondary | ICD-10-CM | POA: Diagnosis not present

## 2020-08-18 DIAGNOSIS — Z79899 Other long term (current) drug therapy: Secondary | ICD-10-CM | POA: Insufficient documentation

## 2020-08-18 DIAGNOSIS — Z87891 Personal history of nicotine dependence: Secondary | ICD-10-CM | POA: Diagnosis not present

## 2020-08-18 DIAGNOSIS — Z20822 Contact with and (suspected) exposure to covid-19: Secondary | ICD-10-CM | POA: Insufficient documentation

## 2020-08-18 DIAGNOSIS — J439 Emphysema, unspecified: Secondary | ICD-10-CM | POA: Diagnosis not present

## 2020-08-18 DIAGNOSIS — I160 Hypertensive urgency: Secondary | ICD-10-CM | POA: Diagnosis not present

## 2020-08-18 DIAGNOSIS — J438 Other emphysema: Secondary | ICD-10-CM | POA: Diagnosis present

## 2020-08-18 DIAGNOSIS — Z7982 Long term (current) use of aspirin: Secondary | ICD-10-CM | POA: Insufficient documentation

## 2020-08-18 HISTORY — DX: Chronic obstructive pulmonary disease, unspecified: J44.9

## 2020-08-18 HISTORY — DX: Essential (primary) hypertension: I10

## 2020-08-18 LAB — BASIC METABOLIC PANEL
Anion gap: 12 (ref 5–15)
BUN: 18 mg/dL (ref 8–23)
CO2: 28 mmol/L (ref 22–32)
Calcium: 9.8 mg/dL (ref 8.9–10.3)
Chloride: 97 mmol/L — ABNORMAL LOW (ref 98–111)
Creatinine, Ser: 0.82 mg/dL (ref 0.44–1.00)
GFR, Estimated: >60 mL/min (ref >60)
Glucose, Bld: 92 mg/dL (ref 70–99)
Potassium: 4 mmol/L (ref 3.5–5.1)
Sodium: 137 mmol/L (ref 135–145)

## 2020-08-18 LAB — CBC
HCT: 41.8 % (ref 36.0–46.0)
Hemoglobin: 14 g/dL (ref 12.0–15.0)
MCH: 31.7 pg (ref 26.0–34.0)
MCHC: 33.5 g/dL (ref 30.0–36.0)
MCV: 94.6 fL (ref 80.0–100.0)
Platelets: 184 10*3/uL (ref 150–400)
RBC: 4.42 MIL/uL (ref 3.87–5.11)
RDW: 13.2 % (ref 11.5–15.5)
WBC: 5 10*3/uL (ref 4.0–10.5)
nRBC: 0 % (ref 0.0–0.2)

## 2020-08-18 LAB — RESPIRATORY PANEL BY RT PCR (FLU A&B, COVID)
Influenza A by PCR: NEGATIVE
Influenza B by PCR: NEGATIVE
SARS Coronavirus 2 by RT PCR: NEGATIVE

## 2020-08-18 LAB — TROPONIN I (HIGH SENSITIVITY)
Troponin I (High Sensitivity): 8 ng/L (ref <18)
Troponin I (High Sensitivity): 9 ng/L (ref <18)

## 2020-08-18 LAB — PROTIME-INR
INR: 0.9 (ref 0.8–1.2)
Prothrombin Time: 11.8 seconds (ref 11.4–15.2)

## 2020-08-18 MED ORDER — IOHEXOL 350 MG/ML SOLN
60.0000 mL | Freq: Once | INTRAVENOUS | Status: AC | PRN
  Administered 2020-08-18: 60 mL via INTRAVENOUS

## 2020-08-18 MED ORDER — LABETALOL HCL 5 MG/ML IV SOLN
0.5000 mg/min | Status: DC
  Filled 2020-08-18: qty 80

## 2020-08-18 MED ORDER — TIOTROPIUM BROMIDE MONOHYDRATE 18 MCG IN CAPS
1.0000 | ORAL_CAPSULE | Freq: Every day | RESPIRATORY_TRACT | Status: DC
  Filled 2020-08-18 (×2): qty 5

## 2020-08-18 MED ORDER — ENOXAPARIN SODIUM 40 MG/0.4ML ~~LOC~~ SOLN: 40.0000 mg | SUBCUTANEOUS | Status: DC

## 2020-08-18 MED ORDER — ASPIRIN EC 81 MG PO TBEC
81.0000 mg | DELAYED_RELEASE_TABLET | Freq: Every day | ORAL | Status: DC
  Administered 2020-08-19: 81 mg via ORAL
  Filled 2020-08-18 (×3): qty 1

## 2020-08-18 MED ORDER — HYDRALAZINE HCL 20 MG/ML IJ SOLN
3.0000 mg | Freq: Once | INTRAMUSCULAR | Status: AC
  Administered 2020-08-18: 3 mg via INTRAVENOUS
  Filled 2020-08-18: qty 1

## 2020-08-18 MED ORDER — METOPROLOL SUCCINATE ER 50 MG PO TB24
100.0000 mg | ORAL_TABLET | Freq: Every day | ORAL | Status: DC
  Administered 2020-08-19: 100 mg via ORAL
  Filled 2020-08-18: qty 2

## 2020-08-18 MED ORDER — SODIUM CHLORIDE 0.9% FLUSH: 3.0000 mL | INTRAVENOUS | Status: DC | PRN

## 2020-08-18 MED ORDER — IPRATROPIUM-ALBUTEROL 0.5-2.5 (3) MG/3ML IN SOLN: 3.0000 mL | Freq: Four times a day (QID) | RESPIRATORY_TRACT | Status: DC | PRN

## 2020-08-18 MED ORDER — SODIUM CHLORIDE 0.9 % IV SOLN: 250.0000 mL | INTRAVENOUS | Status: DC | PRN

## 2020-08-18 MED ORDER — ACETAMINOPHEN 650 MG RE SUPP: 650.0000 mg | Freq: Four times a day (QID) | RECTAL | Status: DC | PRN

## 2020-08-18 MED ORDER — ONDANSETRON HCL 4 MG/2ML IJ SOLN: 4.0000 mg | Freq: Four times a day (QID) | INTRAMUSCULAR | Status: DC | PRN

## 2020-08-18 MED ORDER — HYDROCHLOROTHIAZIDE 12.5 MG PO CAPS
12.5000 mg | ORAL_CAPSULE | Freq: Every day | ORAL | Status: DC
  Administered 2020-08-19: 12.5 mg via ORAL
  Filled 2020-08-18: qty 1

## 2020-08-18 MED ORDER — ACETAMINOPHEN 325 MG PO TABS: 650.0000 mg | ORAL_TABLET | Freq: Four times a day (QID) | ORAL | Status: DC | PRN

## 2020-08-18 MED ORDER — SODIUM CHLORIDE 0.9% FLUSH
3.0000 mL | Freq: Two times a day (BID) | INTRAVENOUS | Status: DC
  Administered 2020-08-18 – 2020-08-19 (×2): 3 mL via INTRAVENOUS

## 2020-08-18 MED ORDER — NITROGLYCERIN 0.4 MG SL SUBL: 0.4000 mg | SUBLINGUAL_TABLET | SUBLINGUAL | Status: DC | PRN

## 2020-08-18 MED ORDER — ALBUTEROL SULFATE HFA 108 (90 BASE) MCG/ACT IN AERS
2.0000 | INHALATION_SPRAY | RESPIRATORY_TRACT | Status: DC | PRN
  Filled 2020-08-18: qty 6.7

## 2020-08-18 MED ORDER — METOPROLOL SUCCINATE ER 50 MG PO TB24: 100.0000 mg | ORAL_TABLET | Freq: Every day | ORAL | Status: DC

## 2020-08-18 MED ORDER — LISINOPRIL 10 MG PO TABS
20.0000 mg | ORAL_TABLET | Freq: Every day | ORAL | Status: DC
  Administered 2020-08-19: 20 mg via ORAL
  Filled 2020-08-18: qty 2

## 2020-08-18 MED ORDER — LISINOPRIL-HYDROCHLOROTHIAZIDE 20-12.5 MG PO TABS: 1.0000 | ORAL_TABLET | Freq: Every day | ORAL | Status: DC

## 2020-08-18 MED ORDER — ONDANSETRON HCL 4 MG PO TABS: 4.0000 mg | ORAL_TABLET | Freq: Four times a day (QID) | ORAL | Status: DC | PRN

## 2020-08-18 MED ORDER — LISINOPRIL 10 MG PO TABS
20.0000 mg | ORAL_TABLET | Freq: Once | ORAL | Status: AC
  Administered 2020-08-18: 20 mg via ORAL
  Filled 2020-08-18: qty 2

## 2020-08-18 NOTE — Consult Note (Signed)
CARDIOLOGY CONSULT NOTE               Patient ID: Erica Cobb MRN: [id number] DOB/AGE: 12/19/1940 79 y.o.  Admit date: 08/18/2020 Referring Physician Dr Joylene Igo Primary Physician Dr Daniel Nones Primary Cardiologist Dr Mariel Kansky Reason for Consultation hypertensive urgency chest pain possible angina  HPI: Patient is a 79 year old female smoker COPD presented with elevated hypertension poorly controlled patient had blood pressures systolic close to 200 she also complained of chest discomfort some shortness of breath even after taking the medication she had no improvement in his symptoms he came to the emergency room for evaluation.  History of spontaneous pneumothorax with worsening symptoms she came to the emergency room for evaluation she has had known coronary disease multiple stents now with anginal symptoms  Review of systems complete and found to be negative unless listed above     Past Medical History:  Diagnosis Date  . COPD (chronic obstructive pulmonary disease) (HCC)   . Hypertension   . 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 Spontaneous pneumothorax     Past Surgical History:  Procedure Laterality Date  . CHEST TUBE INSERTION     for spontaneous pneumo  . stents  2006   LAD  Dr. Alben Spittle    (Not in a hospital admission)  Social History   Socioeconomic History  . Marital status: Married    Spouse name: Not on file  . Number of children: Not on file  . Years of education: Not on file  . Highest education level: Not on file  Occupational History  . Not on file  Tobacco Use  . Smoking status: Former Smoker    Packs/day: 0.50    Years: 15.00    Pack years: 7.50    Types: Cigarettes    Quit date: 2006    Years since quitting: 15.7  . Smokeless tobacco: Never Used  Vaping Use  . Vaping Use: Never used  Substance and Sexual Activity  . Alcohol use: Yes    Alcohol/week: 4.0 standard drinks    Types: 4 Cans of beer per week    Comment: socially  . Drug use: No  .  Sexual activity: Not on file  Other Topics Concern  . Not on file  Social History Narrative  . Not on file   Social Determinants of Health   Financial Resource Strain:   . Difficulty of Paying Living Expenses: Not on file  Food Insecurity:   . Worried About Programme researcher, broadcasting/film/video in the Last Year: Not on file  . Ran Out of Food in the Last Year: Not on file  Transportation Needs:   . Lack of Transportation (Medical): Not on file  . Lack of Transportation (Non-Medical): Not on file  Physical Activity:   . Days of Exercise per Week: Not on file  . Minutes of Exercise per Session: Not on file  Stress:   . Feeling of Stress : Not on file  Social Connections:   . Frequency of Communication with Friends and Family: Not on file  . Frequency of Social Gatherings with Friends and Family: Not on file  . Attends Religious Services: Not on file  . Active Member of Clubs or Organizations: Not on file  . Attends Banker Meetings: Not on file  . Marital Status: Not on file  Intimate Partner Violence:   . Fear of Current or Ex-Partner: Not on file  . Emotionally Abused: Not on file  .  Physically Abused: Not on file  . Sexually Abused: Not on file    Family History  Problem Relation Age of Onset  . Cancer Mother        breast  . Heart disease Father   . Heart disease Brother   . Heart disease Paternal Aunt       Review of systems complete and found to be negative unless listed above      PHYSICAL EXAM  General: Well developed, well nourished, in no acute distress HEENT:  Normocephalic and atramatic Neck:  No JVD.  Lungs: Clear bilaterally to auscultation and percussion. Heart: HRRR . Normal S1 and S2 without gallops or murmurs.  Abdomen: Bowel sounds are positive, abdomen soft and non-tender  Msk:  Back normal, normal gait. Normal strength and tone for age. Extremities: No clubbing, cyanosis or edema.   Neuro: Alert and oriented X 3. Psych:  Good affect, responds  appropriately  Labs:   Lab Results  Component Value Date   WBC 5.0 08/18/2020   HGB 14.0 08/18/2020   HCT 41.8 08/18/2020   MCV 94.6 08/18/2020   PLT 184 08/18/2020    Recent Labs  Lab 08/18/20 1110  NA 137  K 4.0  CL 97*  CO2 28  BUN 18  CREATININE 0.82  CALCIUM 9.8  GLUCOSE 92   No results found for: CKTOTAL, CKMB, CKMBINDEX, TROPONINI No results found for: CHOL No results found for: HDL No results found for: LDLCALC No results found for: TRIG No results found for: CHOLHDL No results found for: LDLDIRECT    Radiology: DG Chest 2 View  Result Date: 08/18/2020 CLINICAL DATA:  Chest pain EXAM: CHEST - 2 VIEW COMPARISON:  11/27/2019 FINDINGS: Normal heart size. Atherosclerotic calcification of the aortic knob. Coronary artery stent is seen. Hyperexpanded lungs with chronically coarsened interstitial markings. No superimposed airspace consolidation. No pleural effusion or pneumothorax. IMPRESSION: COPD without superimposed acute cardiopulmonary process. Electronically Signed   By: Duanne Guess D.O.   On: 08/18/2020 11:34   CT ANGIO CHEST AORTA W/CM & OR WO/CM  Result Date: 08/18/2020 CLINICAL DATA:  Left chest pressure.  Intermittent dyspnea. EXAM: CT ANGIOGRAPHY CHEST WITH CONTRAST TECHNIQUE: Multidetector CT imaging of the chest was performed using the standard protocol before and during bolus administration of intravenous contrast. Multiplanar CT image reconstructions and MIPs were obtained to evaluate the vascular anatomy. CONTRAST:  60mL OMNIPAQUE IOHEXOL 350 MG/ML SOLN COMPARISON:  Chest radiograph from earlier today. 08/11/2016 chest CT. FINDINGS: Cardiovascular: Top-normal heart size. No significant pericardial effusion/thickening. Three-vessel coronary atherosclerosis. Atherosclerotic nonaneurysmal thoracic aorta. Normal caliber pulmonary arteries. No central pulmonary emboli. No evidence of aortic dissection, intramural hematoma, pseudoaneurysm or penetrating  atherosclerotic ulcer. Aortic arch branch vessels are patent. Mixing artifact is noted in the descending thoracic aorta. Mediastinum/Nodes: No discrete thyroid nodules. Unremarkable esophagus. No pathologically enlarged axillary, mediastinal or hilar lymph nodes. Lungs/Pleura: No pneumothorax. No pleural effusion. Severe centrilobular emphysema with diffuse bronchial wall thickening. Partially calcified subpleural bandlike scarring in the posterior upper lobes bilaterally, unchanged. No acute consolidative airspace disease or lung masses. Anterior right upper lobe subpleural 3 mm pulmonary nodule (series 7/image 69), stable, considered benign. No new significant pulmonary nodules. Upper abdomen: Simple exophytic 3.0 cm posterior upper left renal cyst. Musculoskeletal: No aggressive appearing focal osseous lesions. Mild thoracic spondylosis. Review of the MIP images confirms the above findings. IMPRESSION: 1. No acute abnormality. No evidence of thoracic acute aortic syndrome. No central pulmonary embolism. 2. Severe centrilobular emphysema with diffuse  bronchial wall thickening, suggesting COPD. No acute pulmonary disease. 3. Three-vessel coronary atherosclerosis. 4. Aortic Atherosclerosis (ICD10-I70.0) and Emphysema (ICD10-J43.9). Electronically Signed   By: Delbert Phenix M.D.   On: 08/18/2020 13:58    EKG: Normal sinus rhythm nonspecific ST-T wave changes.  Well 1  ASSESSMENT AND PLAN:  Hypertensive urgency CAD Angina COPD Hyperlipidemia Smoking Hx pneumothorax . Plan Agree with admit to telemetry Continue to follow-up EKGs and troponins Aggressive blood pressure control ACE inhibitor beta-blocker Continue statin therapy for hyperlipidemia Consider nitrate therapy for possible angina Echocardiogram for assessment of left ventricular function wall motion Consider functional study versus cardiac cath for assessment of ischemia Outpatient follow-up with cardiology 1 to 2 weeks after  discharge    Signed: Alwyn Pea MD 08/18/2020, 9:44 PM

## 2020-08-18 NOTE — ED Provider Notes (Signed)
[facility] Emergency Department Provider Note   ____________________________________________   First MD Initiated Contact with Patient 08/18/20 1200     (approximate)  I have reviewed the triage vital signs and the nursing notes.   HISTORY  Chief Complaint Chest Pain    HPI A 79 year old patient with a history of hypertension and hypercholesterolemia presents for evaluation of chest pain. Initial onset of pain was more than 6 hours ago. The patient's chest pain is well-localized, is described as heaviness/pressure/tightness, is sharp and is not worse with exertion. The patient's chest pain is middle- or left-sided and does radiate to the arms/jaw/neck. The patient does not complain of nausea and denies diaphoresis. The patient has no history of stroke, has no history of peripheral artery disease, has not smoked in the past 90 days, denies any history of treated diabetes, has no relevant family history of coronary artery disease (first degree relative at less than age 80) and does not have an elevated BMI (>=30).   Pain started 3 days ago.  Feels like a tightness with some sharp components with deep breathing just located over the left lower chest wall, had very minimal radiation towards the arm yesterday.  Went to urgent care, referred here for further work-up  Cardiac history includes prior stenting about 15 years ago.  Additionally, follows with [facility] cardiology.  Has had 2 previous spontaneous pneumothoraces as well, but reports those were both on the right but had a somewhat similar feeling as well  Blood pressure normally about 130 systolic on home checks but noticed today that it was quite high as high as 170 on her home monitor.  Has taken her home medicines already today  Past Medical History:  Diagnosis Date  . COPD (chronic obstructive pulmonary disease) (HCC)   . Hypertension   . 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 Spontaneous pneumothorax      Patient Active Problem List   Diagnosis Date Noted  . Hypertensive urgency 08/18/2020  . 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 Other emphysema (HCC) 08/27/2016  . CAD (coronary artery disease) 08/24/2016  . HLD (hyperlipidemia) 08/24/2016  . HTN (hypertension) 08/24/2016  . Tobacco use 08/24/2016  . Pneumothorax 08/10/2016    Past Surgical History:  Procedure Laterality Date  . CHEST TUBE INSERTION     for spontaneous pneumo  . stents  2006   LAD  Dr. Alben SpittleWeaver    Prior to Admission medications   Medication Sig Start Date End Date Taking? Authorizing Provider  albuterol (VENTOLIN HFA) 108 (90 Base) MCG/ACT inhaler Inhale 2 puffs into the lungs every 4 (four) hours as needed for wheezing or shortness of breath. 01/20/20   Erin FullingKasa, Kurian, MD  aspirin EC 81 MG tablet Take by mouth.    [provider]  lisinopril-hydrochlorothiazide (ZESTORETIC) 20-12.5 MG tablet TAKE 1 TABLET EVERY DAY 08/08/17   [provider]  metoprolol succinate (TOPROL-XL) 100 MG 24 hr tablet TAKE 1 TABLET EVERY DAY 08/08/17   [provider]  nitroGLYCERIN (NITROSTAT) 0.4 MG SL tablet DISSOLVE 1 TABLET UNDER THE TONGUE EVERY 5 MINUTES AS NEEDED FOR CHEST PAIN, MAY TAKE UP TO 3 DOSES 10/13/16   [provider]    Allergies Patient has no known allergies.  Family History  Problem Relation Age of Onset  . Cancer Mother        breast  . Heart disease Father   . Heart disease Brother   . Heart disease Paternal  Aunt     Social History Social History   Tobacco Use  . Smoking status: Former Smoker    Packs/day: 0.50    Years: 15.00    Pack years: 7.50    Types: Cigarettes    Quit date: 2006    Years since quitting: 15.7  . Smokeless tobacco: Never Used  Vaping Use  . Vaping Use: Never used  Substance Use Topics  . Alcohol use: Yes    Alcohol/week: 4.0 standard drinks    Types: 4 Cans of beer per week    Comment: socially  . Drug use: No    Review of  Systems Constitutional: No fever/chills Eyes: No visual changes. ENT: No sore throat. Cardiovascular: See HPI Respiratory: Denies shortness of breath.  Slight reproduction of pain with taking a deep breaths and slight sharp component to the pain over the left lower chest Gastrointestinal: No abdominal pain.   Genitourinary: Negative for dysuria. Musculoskeletal: Negative for back pain. Skin: Negative for rash. Neurological: Negative for headaches, areas of focal weakness or numbness.    ____________________________________________   PHYSICAL EXAM:  VITAL SIGNS: ED Triage Vitals  Enc Vitals Group     BP 08/18/20 1104 (!) 177/100     Pulse Rate 08/18/20 1104 66     Resp 08/18/20 1104 18     Temp 08/18/20 1104 97.6 F (36.4 C)     Temp Source 08/18/20 1104 Oral     SpO2 08/18/20 1104 97 %     Weight 08/18/20 1105 88 lb (39.9 kg)     Height 08/18/20 1105 5' (1.524 m)     Head Circumference --      Peak Flow --      Pain Score 08/18/20 1105 4     Pain Loc --      Pain Edu? --      Excl. in GC? --     Constitutional: Alert and oriented. Well appearing and in no acute distress. Eyes: Conjunctivae are normal. Head: Atraumatic. Nose: No congestion/rhinnorhea. Mouth/Throat: Mucous membranes are moist. Neck: No stridor.  Cardiovascular: Normal rate, regular rhythm. Grossly normal heart sounds.  Good peripheral circulation. Respiratory: Normal respiratory effort.  No retractions. Lungs CTAB.  No noted rashes.  Normal respirations speaks in clear and full sentences without any distress. Gastrointestinal: Soft and nontender. No distention. Musculoskeletal: No lower extremity tenderness nor edema. Neurologic:  Normal speech and language. No gross focal neurologic deficits are appreciated.  Skin:  Skin is warm, dry and intact. No rash noted. Psychiatric: Mood and affect are normal. Speech and behavior are normal.  ____________________________________________   LABS (all labs  ordered are listed, but only abnormal results are displayed)  Labs Reviewed  BASIC METABOLIC PANEL - Abnormal; Notable for the following components:      Result Value   Chloride 97 (*)    All other components within normal limits  RESPIRATORY PANEL BY RT PCR (FLU A&B, COVID)  CBC  PROTIME-INR  TROPONIN I (HIGH SENSITIVITY)  TROPONIN I (HIGH SENSITIVITY)   ____________________________________________  EKG  Reviewed interpreted 11 AM Heart rate 65 QRS 99 QTc 430 Normal sinus rhythm, probable left ventricular hypertrophy.  No obvious acute ischemic changes noted.  Slight artifact.  Possibly some very slight depressions in lateral precordial leads which may be related to possible LVH repolarization abnormality or ischemia   It appears the patient has had 1 previous EKG unfortunately cannot compare for previous I am unable to pull the image   ____________________________________________  RADIOLOGY  DG Chest 2 View  Result Date: 08/18/2020 CLINICAL DATA:  Chest pain EXAM: CHEST - 2 VIEW COMPARISON:  11/27/2019 FINDINGS: Normal heart size. Atherosclerotic calcification of the aortic knob. Coronary artery stent is seen. Hyperexpanded lungs with chronically coarsened interstitial markings. No superimposed airspace consolidation. No pleural effusion or pneumothorax. IMPRESSION: COPD without superimposed acute cardiopulmonary process. Electronically Signed   By: Duanne Guess D.O.   On: 08/18/2020 11:34   CT ANGIO CHEST AORTA W/CM & OR WO/CM  Result Date: 08/18/2020 CLINICAL DATA:  Left chest pressure.  Intermittent dyspnea. EXAM: CT ANGIOGRAPHY CHEST WITH CONTRAST TECHNIQUE: Multidetector CT imaging of the chest was performed using the standard protocol before and during bolus administration of intravenous contrast. Multiplanar CT image reconstructions and MIPs were obtained to evaluate the vascular anatomy. CONTRAST:  66mL OMNIPAQUE IOHEXOL 350 MG/ML SOLN COMPARISON:  Chest radiograph  from earlier today. 08/11/2016 chest CT. FINDINGS: Cardiovascular: Top-normal heart size. No significant pericardial effusion/thickening. Three-vessel coronary atherosclerosis. Atherosclerotic nonaneurysmal thoracic aorta. Normal caliber pulmonary arteries. No central pulmonary emboli. No evidence of aortic dissection, intramural hematoma, pseudoaneurysm or penetrating atherosclerotic ulcer. Aortic arch branch vessels are patent. Mixing artifact is noted in the descending thoracic aorta. Mediastinum/Nodes: No discrete thyroid nodules. Unremarkable esophagus. No pathologically enlarged axillary, mediastinal or hilar lymph nodes. Lungs/Pleura: No pneumothorax. No pleural effusion. Severe centrilobular emphysema with diffuse bronchial wall thickening. Partially calcified subpleural bandlike scarring in the posterior upper lobes bilaterally, unchanged. No acute consolidative airspace disease or lung masses. Anterior right upper lobe subpleural 3 mm pulmonary nodule (series 7/image 69), stable, considered benign. No new significant pulmonary nodules. Upper abdomen: Simple exophytic 3.0 cm posterior upper left renal cyst. Musculoskeletal: No aggressive appearing focal osseous lesions. Mild thoracic spondylosis. Review of the MIP images confirms the above findings. IMPRESSION: 1. No acute abnormality. No evidence of thoracic acute aortic syndrome. No central pulmonary embolism. 2. Severe centrilobular emphysema with diffuse bronchial wall thickening, suggesting COPD. No acute pulmonary disease. 3. Three-vessel coronary atherosclerosis. 4. Aortic Atherosclerosis (ICD10-I70.0) and Emphysema (ICD10-J43.9). Electronically Signed   By: Delbert Phenix M.D.   On: 08/18/2020 13:58     CT angiogram personally viewed by me. I do not see evidence of acute aortic dissection. Final read per radiologist, please refer to radiology read. Of note radiologist does note three-vessel coronary atherosclerosis. COPD  changes ____________________________________________   PROCEDURES  Procedure(s) performed: None  Procedures  Critical Care performed: Yes, see critical care note(s)  Vitals:   08/18/20 1300 08/18/20 1330  BP: (!) 221/177 (!) 168/120  Pulse: 68 77  Resp: 18 12  Temp:    SpO2: 96% 98%    Patient treated with IV hydralazine, oral lisinopril  Patient presents for severe systolic and diastolic hypertension associated with chest pressure. ____________________________________________   INITIAL IMPRESSION / ASSESSMENT AND PLAN / ED COURSE  Pertinent labs & imaging results that were available during my care of the patient were reviewed by me and considered in my medical decision making (see chart for details).   Differential diagnosis includes, but is not limited to, ACS, aortic dissection, pulmonary embolism, cardiac tamponade, pneumothorax, pneumonia, pericarditis, myocarditis, GI-related causes including esophagitis/gastritis, and musculoskeletal chest wall pain.  Hypertensive urgency or emergency also considered.  The patient is notably hypertensive both systolic and diastolic.  Has taken her home medications this morning, will give additional dose of lisinopril orally and monitor.  Currently having very mild symptoms.  Also in the differential as etiology such as dissection given severe elevation  of blood pressure.  Anticipate checking second troponin. ----------------------------------------- 2:40 PM on 08/18/2020 -----------------------------------------   Case discussed with Dr. Juliann Pares  Dr. Juliann Pares agreeable with treatment, recommends consider use of hydralazine or potentially labetalol if hydralazine not improving blood pressure. Patient's chest pressure has improved modestly with blood pressure control. Discussed with the patient, will admit to the hospitalist service for further care and treatment especially given concerns for chest pressure associated with her  hypertensive symptoms, severe hypertension        ____________________________________________   FINAL CLINICAL IMPRESSION(S) / ED DIAGNOSES  Final diagnoses:  Hypertensive urgency        Note:  This document was prepared using Dragon voice recognition software and may include unintentional dictation errors       Sharyn Creamer, MD 08/18/20 1521

## 2020-08-18 NOTE — H&P (Signed)
History and Physical    Erica Cobb ZDG:[id number] DOB: Feb 14, 1941 DOA: 08/18/2020  PCP: Lynnea Ferrier, MD   Patient coming from: Home  I have personally briefly reviewed patient's old medical records in [facility]  Chief Complaint: Chest pressure  HPI: Erica Cobb is a 79 y.o. female with medical history significant for coronary artery disease, hypertension, spontaneous pneumothoraces x 2 and dyslipidemia who presents to the ER for evaluation of chest pressure.  Patient states her symptoms started about 2 days ago and she describes it as pressure-like over the anterior chest wall, sharp and worse with inspiration.  She denies having any radiation of her pain and denies having any shortness of breath, no nausea, no vomiting, no diaphoresis or palpitations.  Patient has checked her blood pressure at home and was elevated with systolic blood pressure of about 170 mmHg. Patient went to the urgent care center and was referred to the emergency room for further evaluation.   At the time of my evaluation patient is chest pain-free. Labs show sodium 137, potassium 4.0, chloride 97, bicarb 28, BUN 18, creatinine 0.82, calcium 9.8, troponin VIII, white count 5.0, hemoglobin 14, hematocrit 41.8, MCV 94.6, RDW 13.2, platelet count 184. CT angiogram of the chest showed no acute abnormality.  No evidence of thoracic acute aortic syndrome.  Severe centrilobular emphysema with diffuse bronchial wall thickening suggesting COPD.  No acute pulmonary disease.  Three-vessel coronary atherosclerosis. Chest x-ray reviewed by me shows hyperinflated lungs. Twelve-lead EKG reviewed by me shows normal sinus rhythm with LVH   ED Course: Patient is a 79 year old Caucasian female with a history of coronary artery disease, hypertension, dyslipidemia as well as COPD who presents to the ER for evaluation of chest pressure which is intermittent, worse with inspiration without any associated shortness of breath, nausea,  vomiting, palpitations or diaphoresis.  Patient has ruled out for an acute coronary syndrome and had a CT angiogram of the chest which was negative for an aneurysm.  Her blood pressure is significantly elevated and she will be admitted to the hospital to optimize her blood pressure control  Review of Systems: As per HPI otherwise 10 point review of systems negative.  This is the only day and will leave before the   Past Medical History:  Diagnosis Date  . COPD (chronic obstructive pulmonary disease) (HCC)   . Hypertension   . 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 Spontaneous pneumothorax     Past Surgical History:  Procedure Laterality Date  . CHEST TUBE INSERTION     for spontaneous pneumo  . stents  2006   LAD  Dr. Alben Spittle     reports that she quit smoking about 15 years ago. Her smoking use included cigarettes. She has a 7.50 pack-year smoking history. She has never used smokeless tobacco. She reports current alcohol use of about 4.0 standard drinks of alcohol per week. She reports that she does not use drugs.  No Known Allergies  Family History  Problem Relation Age of Onset  . Cancer Mother        breast  . Heart disease Father   . Heart disease Brother   . Heart disease Paternal Aunt      Prior to Admission medications   Medication Sig Start Date End Date Taking? Authorizing Provider  albuterol (VENTOLIN HFA) 108 (90 Base) MCG/ACT inhaler Inhale 2 puffs into the lungs every 4 (four) hours as needed for wheezing or shortness of breath. 01/20/20  Erin Fulling, MD  aspirin EC 81 MG tablet Take 81 mg by mouth daily.     [provider]  lisinopril-hydrochlorothiazide (ZESTORETIC) 20-12.5 MG tablet TAKE 1 TABLET EVERY DAY 08/08/17   [provider]  metoprolol succinate (TOPROL-XL) 100 MG 24 hr tablet Take 100 mg by mouth daily.  08/08/17   [provider]  nitroGLYCERIN (NITROSTAT) 0.4 MG SL tablet DISSOLVE 1 TABLET UNDER THE TONGUE EVERY 5 MINUTES AS NEEDED FOR  CHEST PAIN, MAY TAKE UP TO 3 DOSES 10/13/16   [provider]  SPIRIVA RESPIMAT 1.25 MCG/ACT AERS Inhale 2 puffs into the lungs daily. 07/15/20   [provider]    Physical Exam: Vitals:   08/18/20 1200 08/18/20 1230 08/18/20 1300 08/18/20 1330  BP: (!) 195/136 (!) 201/125 (!) 221/177 (!) 168/120  Pulse: 69 71 68 77  Resp: 20 (!) 22 18 12   Temp:      TempSrc:      SpO2: 99% 96% 96% 98%  Weight:      Height:         Vitals:   08/18/20 1200 08/18/20 1230 08/18/20 1300 08/18/20 1330  BP: (!) 195/136 (!) 201/125 (!) 221/177 (!) 168/120  Pulse: 69 71 68 77  Resp: 20 (!) 22 18 12   Temp:      TempSrc:      SpO2: 99% 96% 96% 98%  Weight:      Height:        Constitutional: NAD, alert and oriented x 3 Eyes: PERRL, lids and conjunctivae pallor ENMT: Mucous membranes are moist.  Neck: normal, supple, no masses, no thyromegaly Respiratory: clear to auscultation bilaterally, no wheezing, no crackles. Normal respiratory effort. No accessory muscle use. Cardiovascular: Regular rate and rhythm, no murmurs / rubs / gallops. No extremity edema. 2+ pedal pulses. No carotid bruits.  Abdomen: no tenderness, no masses palpated. No hepatosplenomegaly. Bowel sounds positive.  Musculoskeletal: no clubbing / cyanosis. No joint deformity upper and lower extremities.  Skin: no rashes, lesions, ulcers.  Neurologic: No gross focal neurologic deficit. Psychiatric: Normal mood and affect.   Labs on Admission: I have personally reviewed following labs and imaging studies  CBC: Recent Labs  Lab 08/18/20 1110  WBC 5.0  HGB 14.0  HCT 41.8  MCV 94.6  PLT 184   Basic Metabolic Panel: Recent Labs  Lab 08/18/20 1110  NA 137  K 4.0  CL 97*  CO2 28  GLUCOSE 92  BUN 18  CREATININE 0.82  CALCIUM 9.8   GFR: Estimated Creatinine Clearance: 35 mL/min (by C-G formula based on SCr of 0.82 mg/dL). Liver Function Tests: No results for input(s): AST, ALT, ALKPHOS, BILITOT, PROT,  ALBUMIN in the last 168 hours. No results for input(s): LIPASE, AMYLASE in the last 168 hours. No results for input(s): AMMONIA in the last 168 hours. Coagulation Profile: Recent Labs  Lab 08/18/20 1110  INR 0.9   Cardiac Enzymes: No results for input(s): CKTOTAL, CKMB, CKMBINDEX, TROPONINI in the last 168 hours. BNP (last 3 results) No results for input(s): PROBNP in the last 8760 hours. HbA1C: No results for input(s): HGBA1C in the last 72 hours. CBG: No results for input(s): GLUCAP in the last 168 hours. Lipid Profile: No results for input(s): CHOL, HDL, LDLCALC, TRIG, CHOLHDL, LDLDIRECT in the last 72 hours. Thyroid Function Tests: No results for input(s): TSH, T4TOTAL, FREET4, T3FREE, THYROIDAB in the last 72 hours. Anemia Panel: No results for input(s): VITAMINB12, FOLATE, FERRITIN, TIBC, IRON, RETICCTPCT in the  last 72 hours. Urine analysis: No results found for: COLORURINE, APPEARANCEUR, LABSPEC, PHURINE, GLUCOSEU, HGBUR, BILIRUBINUR, KETONESUR, PROTEINUR, UROBILINOGEN, NITRITE, LEUKOCYTESUR  Radiological Exams on Admission: DG Chest 2 View  Result Date: 08/18/2020 CLINICAL DATA:  Chest pain EXAM: CHEST - 2 VIEW COMPARISON:  11/27/2019 FINDINGS: Normal heart size. Atherosclerotic calcification of the aortic knob. Coronary artery stent is seen. Hyperexpanded lungs with chronically coarsened interstitial markings. No superimposed airspace consolidation. No pleural effusion or pneumothorax. IMPRESSION: COPD without superimposed acute cardiopulmonary process. Electronically Signed   By: Duanne Guess D.O.   On: 08/18/2020 11:34   CT ANGIO CHEST AORTA W/CM & OR WO/CM  Result Date: 08/18/2020 CLINICAL DATA:  Left chest pressure.  Intermittent dyspnea. EXAM: CT ANGIOGRAPHY CHEST WITH CONTRAST TECHNIQUE: Multidetector CT imaging of the chest was performed using the standard protocol before and during bolus administration of intravenous contrast. Multiplanar CT image  reconstructions and MIPs were obtained to evaluate the vascular anatomy. CONTRAST:  58mL OMNIPAQUE IOHEXOL 350 MG/ML SOLN COMPARISON:  Chest radiograph from earlier today. 08/11/2016 chest CT. FINDINGS: Cardiovascular: Top-normal heart size. No significant pericardial effusion/thickening. Three-vessel coronary atherosclerosis. Atherosclerotic nonaneurysmal thoracic aorta. Normal caliber pulmonary arteries. No central pulmonary emboli. No evidence of aortic dissection, intramural hematoma, pseudoaneurysm or penetrating atherosclerotic ulcer. Aortic arch branch vessels are patent. Mixing artifact is noted in the descending thoracic aorta. Mediastinum/Nodes: No discrete thyroid nodules. Unremarkable esophagus. No pathologically enlarged axillary, mediastinal or hilar lymph nodes. Lungs/Pleura: No pneumothorax. No pleural effusion. Severe centrilobular emphysema with diffuse bronchial wall thickening. Partially calcified subpleural bandlike scarring in the posterior upper lobes bilaterally, unchanged. No acute consolidative airspace disease or lung masses. Anterior right upper lobe subpleural 3 mm pulmonary nodule (series 7/image 69), stable, considered benign. No new significant pulmonary nodules. Upper abdomen: Simple exophytic 3.0 cm posterior upper left renal cyst. Musculoskeletal: No aggressive appearing focal osseous lesions. Mild thoracic spondylosis. Review of the MIP images confirms the above findings. IMPRESSION: 1. No acute abnormality. No evidence of thoracic acute aortic syndrome. No central pulmonary embolism. 2. Severe centrilobular emphysema with diffuse bronchial wall thickening, suggesting COPD. No acute pulmonary disease. 3. Three-vessel coronary atherosclerosis. 4. Aortic Atherosclerosis (ICD10-I70.0) and Emphysema (ICD10-J43.9). Electronically Signed   By: Delbert Phenix M.D.   On: 08/18/2020 13:58    EKG: Independently reviewed.  Normal sinus rhythm LVH  Assessment/Plan Principal Problem:    Hypertensive urgency Active Problems:   CAD (coronary artery disease)   Other emphysema (HCC)    Hypertensive urgency Patient has history of hypertension and is on multiple antihypertensive medications She presents for evaluation of chest pressure and noted to have significantly elevated blood pressure We will place patient on labetalol and titrate for systolic blood pressure less than or equal to or diastolic blood pressure less than Continue lisinopril/HCTZ and metoprolol Uptitrate oral antihypertensive medications to optimize blood pressure control   History of coronary artery disease Continue aspirin and beta-blockers Patient has ruled out for an acute coronary syndrome   COPD Continue Spiriva Continue as needed bronchodilator therapy   DVT prophylaxis: Lovenox Code Status: DNR Family Communication: 50% of time was spent discussing patient's condition and plan of care with her and her husband at the bedside.  CODE STATUS was discussed and she is a DO NOT RESUSCITATE Disposition Plan: Back to previous home environment Consults called: Cardiology    Viveka Wilmeth MD [facility]     08/18/2020, 4:45 PM

## 2020-08-18 NOTE — ED Triage Notes (Signed)
Pt here from [facility] with c/o left sided Chest tightness.

## 2020-08-18 NOTE — ED Notes (Signed)
This RN gave patient dinner tray, drink, warm blankets, and pillow at this time. Pt comfortable in bed, pt educated on how to unhook herself to go to the bathroom but also educated on hitting the call light if any needs noted. Will continue to monitor.

## 2020-08-18 NOTE — ED Triage Notes (Signed)
Patient c/o left sided chest pressure. No radiation. A&O x4 in triage. intermittent sob

## 2020-08-19 DIAGNOSIS — I160 Hypertensive urgency: Secondary | ICD-10-CM

## 2020-08-19 LAB — CBC
HCT: 37.3 % (ref 36.0–46.0)
Hemoglobin: 12.7 g/dL (ref 12.0–15.0)
MCH: 31.9 pg (ref 26.0–34.0)
MCHC: 34 g/dL (ref 30.0–36.0)
MCV: 93.7 fL (ref 80.0–100.0)
Platelets: 186 10*3/uL (ref 150–400)
RBC: 3.98 MIL/uL (ref 3.87–5.11)
RDW: 13.2 % (ref 11.5–15.5)
WBC: 3.9 10*3/uL — ABNORMAL LOW (ref 4.0–10.5)
nRBC: 0 % (ref 0.0–0.2)

## 2020-08-19 LAB — BASIC METABOLIC PANEL
Anion gap: 10 (ref 5–15)
BUN: 19 mg/dL (ref 8–23)
CO2: 29 mmol/L (ref 22–32)
Calcium: 9.2 mg/dL (ref 8.9–10.3)
Chloride: 99 mmol/L (ref 98–111)
Creatinine, Ser: 0.81 mg/dL (ref 0.44–1.00)
GFR, Estimated: >60 mL/min (ref >60)
Glucose, Bld: 87 mg/dL (ref 70–99)
Potassium: 3.7 mmol/L (ref 3.5–5.1)
Sodium: 138 mmol/L (ref 135–145)

## 2020-08-19 MED ORDER — AMLODIPINE BESYLATE 5 MG PO TABS: 5.0000 mg | ORAL_TABLET | Freq: Every day | ORAL | 2 refills | Status: AC

## 2020-08-19 MED ORDER — ENOXAPARIN SODIUM 30 MG/0.3ML ~~LOC~~ SOLN
30.0000 mg | SUBCUTANEOUS | Status: DC
  Filled 2020-08-19: qty 0.3

## 2020-08-19 MED ORDER — AMLODIPINE BESYLATE 5 MG PO TABS
5.0000 mg | ORAL_TABLET | Freq: Every day | ORAL | Status: DC
  Administered 2020-08-19: 5 mg via ORAL
  Filled 2020-08-19: qty 1

## 2020-08-19 NOTE — Progress Notes (Signed)
Erica Cobb to be D/C'd Home per MD order.  Discussed prescriptions and follow up appointments with the patient. Prescriptions given to patient, medication list explained in detail. Pt verbalized understanding.  Allergies as of 08/19/2020   No Known Allergies     Medication List    TAKE these medications   albuterol 108 (90 Base) MCG/ACT inhaler Commonly known as: VENTOLIN HFA Inhale 2 puffs into the lungs every 4 (four) hours as needed for wheezing or shortness of breath.   amLODipine 5 MG tablet Commonly known as: NORVASC Take 1 tablet (5 mg total) by mouth daily.   aspirin EC 81 MG tablet Take 81 mg by mouth daily.   atorvastatin 40 MG tablet Commonly known as: LIPITOR Take 40 mg by mouth daily.   lisinopril-hydrochlorothiazide 20-12.5 MG tablet Commonly known as: ZESTORETIC TAKE 1 TABLET EVERY DAY   metoprolol succinate 100 MG 24 hr tablet Commonly known as: TOPROL-XL Take 100 mg by mouth daily.   nitroGLYCERIN 0.4 MG SL tablet Commonly known as: NITROSTAT DISSOLVE 1 TABLET UNDER THE TONGUE EVERY 5 MINUTES AS NEEDED FOR CHEST PAIN, MAY TAKE UP TO 3 DOSES   Spiriva Respimat 1.25 MCG/ACT Aers Generic drug: Tiotropium Bromide Monohydrate Inhale 2 puffs into the lungs daily.       Vitals:   08/19/20 0915 08/19/20 1141  BP: (!) 156/92 (!) 140/97  Pulse:    Resp:    Temp:    SpO2:      IV catheter discontinued intact. Site without signs and symptoms of complications. Dressing and pressure applied. Pt denies pain at this time. No complaints noted.  An After Visit Summary was printed and given to the patient. Patient escorted via WC, and D/C home via private auto.  Rigoberto Noel

## 2020-08-19 NOTE — Discharge Summary (Signed)
Erica FettersKaren Yordy ZOX:[id number] DOB: 20-Dec-1940 DOA: 08/18/2020  PCP: Lynnea FerrierKlein, Bert J III, MD  Admit date: 08/18/2020 Discharge date: 08/19/2020  Admitted From: Home Disposition: Home  Recommendations for Outpatient Follow-up:  1. Follow up with PCP in 1 week 2. Please obtain BMP/CBC in one week 3. Cardiology Dr. Lady GaryFath in 1 week    Discharge Condition:Stable CODE STATUS: DNR Diet recommendation: Heart Healthy   Brief/Interim Summary: Erica FettersKaren Gurr is a 79 y.o. female with medical history significant for coronary artery disease, hypertension, spontaneous pneumothoraces x 2 and dyslipidemia who presents to the ER for evaluation of chest pressure.  Patient states her symptoms started about 2 days ago and she describes it as pressure-like over the anterior chest wall, sharp and worse with inspiration.  She was found with hypertensive urgency and chest pressure was likely due to that.  Troponins was negative.  Cardiology was consulted.  Hypertensive urgency Continue home meds as bp better now.  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 Quit smoking 15 years ago  Discharge Diagnoses:  Principal Problem:   Hypertensive urgency Active Problems:   CAD (coronary artery disease)   Other emphysema (HCC)    Discharge Instructions  Discharge Instructions    Call MD for:  difficulty breathing, headache or visual disturbances   Complete by: As directed    Diet - low sodium heart healthy   Complete by: As directed    Increase activity slowly   Complete by: As directed      Allergies as of 08/19/2020   No Known Allergies     Medication List    TAKE these medications   albuterol 108 (90 Base) MCG/ACT  inhaler Commonly known as: VENTOLIN HFA Inhale 2 puffs into the lungs every 4 (four) hours as needed for wheezing or shortness of breath.   amLODipine 5 MG tablet Commonly known as: NORVASC Take 1 tablet (5 mg total) by mouth daily.   aspirin EC 81 MG tablet Take 81 mg by mouth daily.   atorvastatin 40 MG tablet Commonly known as: LIPITOR Take 40 mg by mouth daily.   lisinopril-hydrochlorothiazide 20-12.5 MG tablet Commonly known as: ZESTORETIC TAKE 1 TABLET EVERY DAY   metoprolol succinate 100 MG 24 hr tablet Commonly known as: TOPROL-XL Take 100 mg by mouth daily.   nitroGLYCERIN 0.4 MG SL tablet Commonly known as: NITROSTAT DISSOLVE 1 TABLET UNDER THE TONGUE EVERY 5 MINUTES AS NEEDED FOR CHEST PAIN, MAY TAKE UP TO 3 DOSES   Spiriva Respimat 1.25 MCG/ACT Aers Generic drug: Tiotropium Bromide Monohydrate Inhale 2 puffs into the lungs daily.       Follow-up Information    Dalia HeadingFath, Kenneth A, MD Follow up in 1 week(s).   Specialty: Cardiology Contact information: [address] [place] [address] [phone]        Curtis SitesKlein, Bert J III, MD Follow up in 1 week(s).   Specialty: Internal Medicine Contact information: [address] [facility] [place] [address] [phone]              No Known Allergies  Consultations:  Cardiology   Procedures/Studies: DG Chest 2 View  Result Date: 08/18/2020 CLINICAL DATA:  Chest pain EXAM: CHEST - 2 VIEW COMPARISON:  11/27/2019 FINDINGS:  Normal heart size. Atherosclerotic calcification of the aortic knob. Coronary artery stent is seen. Hyperexpanded lungs with chronically coarsened interstitial markings. No superimposed airspace consolidation. No pleural effusion or pneumothorax. IMPRESSION: COPD without superimposed acute cardiopulmonary process. Electronically Signed   By: Duanne Guess D.O.   On: 08/18/2020 11:34   CT ANGIO CHEST AORTA W/CM & OR WO/CM  Result Date:  08/18/2020 CLINICAL DATA:  Left chest pressure.  Intermittent dyspnea. EXAM: CT ANGIOGRAPHY CHEST WITH CONTRAST TECHNIQUE: Multidetector CT imaging of the chest was performed using the standard protocol before and during bolus administration of intravenous contrast. Multiplanar CT image reconstructions and MIPs were obtained to evaluate the vascular anatomy. CONTRAST:  63mL OMNIPAQUE IOHEXOL 350 MG/ML SOLN COMPARISON:  Chest radiograph from earlier today. 08/11/2016 chest CT. FINDINGS: Cardiovascular: Top-normal heart size. No significant pericardial effusion/thickening. Three-vessel coronary atherosclerosis. Atherosclerotic nonaneurysmal thoracic aorta. Normal caliber pulmonary arteries. No central pulmonary emboli. No evidence of aortic dissection, intramural hematoma, pseudoaneurysm or penetrating atherosclerotic ulcer. Aortic arch branch vessels are patent. Mixing artifact is noted in the descending thoracic aorta. Mediastinum/Nodes: No discrete thyroid nodules. Unremarkable esophagus. No pathologically enlarged axillary, mediastinal or hilar lymph nodes. Lungs/Pleura: No pneumothorax. No pleural effusion. Severe centrilobular emphysema with diffuse bronchial wall thickening. Partially calcified subpleural bandlike scarring in the posterior upper lobes bilaterally, unchanged. No acute consolidative airspace disease or lung masses. Anterior right upper lobe subpleural 3 mm pulmonary nodule (series 7/image 69), stable, considered benign. No new significant pulmonary nodules. Upper abdomen: Simple exophytic 3.0 cm posterior upper left renal cyst. Musculoskeletal: No aggressive appearing focal osseous lesions. Mild thoracic spondylosis. Review of the MIP images confirms the above findings. IMPRESSION: 1. No acute abnormality. No evidence of thoracic acute aortic syndrome. No central pulmonary embolism. 2. Severe centrilobular emphysema with diffuse bronchial wall thickening, suggesting COPD. No acute pulmonary  disease. 3. Three-vessel coronary atherosclerosis. 4. Aortic Atherosclerosis (ICD10-I70.0) and Emphysema (ICD10-J43.9). Electronically Signed   By: Delbert Phenix M.D.   On: 08/18/2020 13:58       Subjective: Feels better.  No headache, double vision, chest pain or chest pressure or shortness of breath  Discharge Exam: Vitals:   08/19/20 0915 08/19/20 1141  BP: (!) 156/92 (!) 140/97  Pulse:    Resp:    Temp:    SpO2:     Vitals:   08/19/20 0845 08/19/20 0915 08/19/20 0915 08/19/20 1141  BP:  (!) 156/92 (!) 156/92 (!) 140/97  Pulse: 65     Resp: 19 19    Temp:      TempSrc:      SpO2: (!) 83%     Weight:      Height:        General: Pt is alert, awake, not in acute distress Cardiovascular: RRR, S1/S2 +, no rubs, no gallops Respiratory: CTA bilaterally, no wheezing, no rhonchi Abdominal: Soft, NT, ND, bowel sounds + Extremities: no edema, no cyanosis    The results of significant diagnostics from this hospitalization (including imaging, microbiology, ancillary and laboratory) are listed below for reference.     Microbiology: Recent Results (from the past 240 hour(s))  Respiratory Panel by RT PCR (Flu A&B, Covid) - Nasopharyngeal Swab     Status: None   Collection Time: 08/18/20  7:22 PM   Specimen: Nasopharyngeal Swab  Result Value Ref Range Status   SARS Coronavirus 2 by RT PCR NEGATIVE NEGATIVE Final    Comment: (NOTE) SARS-CoV-2 target nucleic acids are NOT DETECTED.  The SARS-CoV-2 RNA is generally  detectable in upper respiratoy specimens during the acute phase of infection. The lowest concentration of SARS-CoV-2 viral copies this assay can detect is 131 copies/mL. A negative result does not preclude SARS-Cov-2 infection and should not be used as the sole basis for treatment or other patient management decisions. A negative result may occur with  improper specimen collection/handling, submission of specimen other than nasopharyngeal swab, presence of viral  mutation(s) within the areas targeted by this assay, and inadequate number of viral copies (<131 copies/mL). A negative result must be combined with clinical observations, patient history, and epidemiological information. The expected result is Negative.  Fact Sheet for Patients:  https://www.moore.com/  Fact Sheet for Healthcare Providers:  https://www.young.biz/  This test is no t yet approved or cleared by the [place] FDA and  has been authorized for detection and/or diagnosis of SARS-CoV-2 by FDA under an Emergency Use Authorization (EUA). This EUA will remain  in effect (meaning this test can be used) for the duration of the COVID-19 declaration under Section 564(b)(1) of the Act, 21 U.S.C. section 360bbb-3(b)(1), unless the authorization is terminated or revoked sooner.     Influenza A by PCR NEGATIVE NEGATIVE Final   Influenza B by PCR NEGATIVE NEGATIVE Final    Comment: (NOTE) The Xpert Xpress SARS-CoV-2/FLU/RSV assay is intended as an aid in  the diagnosis of influenza from Nasopharyngeal swab specimens and  should not be used as a sole basis for treatment. Nasal washings and  aspirates are unacceptable for Xpert Xpress SARS-CoV-2/FLU/RSV  testing.  Fact Sheet for Patients: https://www.moore.com/  Fact Sheet for Healthcare Providers: https://www.young.biz/  This test is not yet approved or cleared by the [place] FDA and  has been authorized for detection and/or diagnosis of SARS-CoV-2 by  FDA under an Emergency Use Authorization (EUA). This EUA will remain  in effect (meaning this test can be used) for the duration of the  Covid-19 declaration under Section 564(b)(1) of the Act, 21  U.S.C. section 360bbb-3(b)(1), unless the authorization is  terminated or revoked. Performed at [facility], [address] [place], [place] [address]      Labs: BNP (last 3  results) No results for input(s): BNP in the last 8760 hours. Basic Metabolic Panel: Recent Labs  Lab 08/18/20 1110 08/19/20 0357  NA 137 138  K 4.0 3.7  CL 97* 99  CO2 28 29  GLUCOSE 92 87  BUN 18 19  CREATININE 0.82 0.81  CALCIUM 9.8 9.2   Liver Function Tests: No results for input(s): AST, ALT, ALKPHOS, BILITOT, PROT, ALBUMIN in the last 168 hours. No results for input(s): LIPASE, AMYLASE in the last 168 hours. No results for input(s): AMMONIA in the last 168 hours. CBC: Recent Labs  Lab 08/18/20 1110 08/19/20 0357  WBC 5.0 3.9*  HGB 14.0 12.7  HCT 41.8 37.3  MCV 94.6 93.7  PLT 184 186   Cardiac Enzymes: No results for input(s): CKTOTAL, CKMB, CKMBINDEX, TROPONINI in the last 168 hours. BNP: Invalid input(s): POCBNP CBG: No results for input(s): GLUCAP in the last 168 hours. D-Dimer No results for input(s): DDIMER in the last 72 hours. Hgb A1c No results for input(s): HGBA1C in the last 72 hours. Lipid Profile No results for input(s): CHOL, HDL, LDLCALC, TRIG, CHOLHDL, LDLDIRECT in the last 72 hours. Thyroid function studies No results for input(s): TSH, T4TOTAL, T3FREE, THYROIDAB in the last 72 hours.  Invalid input(s): FREET3 Anemia work up No results for input(s): VITAMINB12, FOLATE, FERRITIN, TIBC, IRON, RETICCTPCT  in the last 72 hours. Urinalysis No results found for: COLORURINE, APPEARANCEUR, LABSPEC, PHURINE, GLUCOSEU, HGBUR, BILIRUBINUR, KETONESUR, PROTEINUR, UROBILINOGEN, NITRITE, LEUKOCYTESUR Sepsis Labs Invalid input(s): PROCALCITONIN,  WBC,  LACTICIDVEN Microbiology Recent Results (from the past 240 hour(s))  Respiratory Panel by RT PCR (Flu A&B, Covid) - Nasopharyngeal Swab     Status: None   Collection Time: 08/18/20  7:22 PM   Specimen: Nasopharyngeal Swab  Result Value Ref Range Status   SARS Coronavirus 2 by RT PCR NEGATIVE NEGATIVE Final    Comment: (NOTE) SARS-CoV-2 target nucleic acids are NOT DETECTED.  The SARS-CoV-2 RNA is  generally detectable in upper respiratoy specimens during the acute phase of infection. The lowest concentration of SARS-CoV-2 viral copies this assay can detect is 131 copies/mL. A negative result does not preclude SARS-Cov-2 infection and should not be used as the sole basis for treatment or other patient management decisions. A negative result may occur with  improper specimen collection/handling, submission of specimen other than nasopharyngeal swab, presence of viral mutation(s) within the areas targeted by this assay, and inadequate number of viral copies (<131 copies/mL). A negative result must be combined with clinical observations, patient history, and epidemiological information. The expected result is Negative.  Fact Sheet for Patients:  https://www.moore.com/  Fact Sheet for Healthcare Providers:  https://www.young.biz/  This test is no t yet approved or cleared by the [place] FDA and  has been authorized for detection and/or diagnosis of SARS-CoV-2 by FDA under an Emergency Use Authorization (EUA). This EUA will remain  in effect (meaning this test can be used) for the duration of the COVID-19 declaration under Section 564(b)(1) of the Act, 21 U.S.C. section 360bbb-3(b)(1), unless the authorization is terminated or revoked sooner.     Influenza A by PCR NEGATIVE NEGATIVE Final   Influenza B by PCR NEGATIVE NEGATIVE Final    Comment: (NOTE) The Xpert Xpress SARS-CoV-2/FLU/RSV assay is intended as an aid in  the diagnosis of influenza from Nasopharyngeal swab specimens and  should not be used as a sole basis for treatment. Nasal washings and  aspirates are unacceptable for Xpert Xpress SARS-CoV-2/FLU/RSV  testing.  Fact Sheet for Patients: https://www.moore.com/  Fact Sheet for Healthcare Providers: https://www.young.biz/  This test is not yet approved or cleared by the [place] FDA and  has been authorized for detection and/or diagnosis of SARS-CoV-2 by  FDA under an Emergency Use Authorization (EUA). This EUA will remain  in effect (meaning this test can be used) for the duration of the  Covid-19 declaration under Section 564(b)(1) of the Act, 21  U.S.C. section 360bbb-3(b)(1), unless the authorization is  terminated or revoked. Performed at [facility], [address], [place], [place] [address]      Time coordinating discharge: Over 30 minutes  SIGNED:   Lynn Ito, MD  [facility] 08/19/2020, 4:18 PM Pager   If 7PM-7AM, please contact night-coverage [email or web address] Password [email or web address]

## 2020-08-19 NOTE — Discharge Instructions (Signed)

## 2020-08-19 NOTE — Care Management CC44 (Signed)
Condition Code 44 Documentation Completed  Patient Details  Name: Saleemah Mollenhauer MRN: [id number] Date of Birth: 08/09/1941   Condition Code 44 given:  Yes Patient signature on Condition Code 44 notice:  Yes Documentation of 2 MD's agreement:  Yes Code 44 added to claim:  Yes    Shawn Route, RN 08/19/2020, 11:23 AM

## 2020-08-19 NOTE — Progress Notes (Signed)
Patient Name: Erica Cobb Date of Encounter: 08/19/2020  Hospital Problem List     Principal Problem:   Hypertensive urgency Active Problems:   CAD (coronary artery disease)   Other emphysema [facility])    Patient Profile     Patient is a 79 year old female smoker COPD presented with elevated hypertension poorly controlled patient had blood pressures systolic close to 200 she also complained of chest discomfort some shortness of breath even after taking the medication she had no improvement in his symptoms he came to the emergency room for evaluation.  History of spontaneous pneumothorax with worsening symptoms she came to the emergency room for evaluation she has had known coronary disease multiple stents now with anginal symptoms  Subjective   Feels good.  No chest pain.  Ambulating in ER without difficulty.  Inpatient Medications     aspirin EC  81 mg Oral Daily   enoxaparin (LOVENOX) injection  30 mg Subcutaneous Q24H   lisinopril  20 mg Oral Daily   And   hydrochlorothiazide  12.5 mg Oral Daily   metoprolol succinate  100 mg Oral Daily   sodium chloride flush  3 mL Intravenous Q12H   tiotropium  1 capsule Inhalation Daily    Vital Signs    Vitals:   08/19/20 0400 08/19/20 0600 08/19/20 0800 08/19/20 0915  BP: (!) 174/91 126/73 129/90 (!) 156/92  Pulse: 71 60 88   Resp: 16 16 19    Temp:   97.8 F (36.6 C)   TempSrc:   Oral   SpO2: 93% 95% 95%   Weight:      Height:       No intake or output data in the 24 hours ending 08/19/20 0940 Filed Weights   08/18/20 1105  Weight: 39.9 kg    Physical Exam    GEN: Well nourished, well developed, in no acute distress.  HEENT: normal.  Neck: Supple, no JVD, carotid bruits, or masses. Cardiac: RRR, no murmurs, rubs, or gallops. No clubbing, cyanosis, edema.  Radials/DP/PT 2+ and equal bilaterally.  Respiratory:  Respirations regular and unlabored, clear to auscultation bilaterally. GI: Soft, nontender,  nondistended, BS + x 4. MS: no deformity or atrophy. Skin: warm and dry, no rash. Neuro:  Strength and sensation are intact. Psych: Normal affect.  Labs    CBC Recent Labs    08/18/20 1110 08/19/20 0357  WBC 5.0 3.9*  HGB 14.0 12.7  HCT 41.8 37.3  MCV 94.6 93.7  PLT 184 186   Basic Metabolic Panel Recent Labs    08/21/20 1110 08/19/20 0357  NA 137 138  K 4.0 3.7  CL 97* 99  CO2 28 29  GLUCOSE 92 87  BUN 18 19  CREATININE 0.82 0.81  CALCIUM 9.8 9.2   Liver Function Tests No results for input(s): AST, ALT, ALKPHOS, BILITOT, PROT, ALBUMIN in the last 72 hours. No results for input(s): LIPASE, AMYLASE in the last 72 hours. Cardiac Enzymes No results for input(s): CKTOTAL, CKMB, CKMBINDEX, TROPONINI in the last 72 hours. BNP No results for input(s): BNP in the last 72 hours. D-Dimer No results for input(s): DDIMER in the last 72 hours. Hemoglobin A1C No results for input(s): HGBA1C in the last 72 hours. Fasting Lipid Panel No results for input(s): CHOL, HDL, LDLCALC, TRIG, CHOLHDL, LDLDIRECT in the last 72 hours. Thyroid Function Tests No results for input(s): TSH, T4TOTAL, T3FREE, THYROIDAB in the last 72 hours.  Invalid input(s): FREET3  Telemetry    Normal sinus rhythm  with no ischemia  ECG    Normal sinus rhythm with no ischemia  Radiology    DG Chest 2 View  Result Date: 08/18/2020 CLINICAL DATA:  Chest pain EXAM: CHEST - 2 VIEW COMPARISON:  11/27/2019 FINDINGS: Normal heart size. Atherosclerotic calcification of the aortic knob. Coronary artery stent is seen. Hyperexpanded lungs with chronically coarsened interstitial markings. No superimposed airspace consolidation. No pleural effusion or pneumothorax. IMPRESSION: COPD without superimposed acute cardiopulmonary process. Electronically Signed   By: Duanne Guess D.O.   On: 08/18/2020 11:34   CT ANGIO CHEST AORTA W/CM & OR WO/CM  Result Date: 08/18/2020 CLINICAL DATA:  Left chest pressure.   Intermittent dyspnea. EXAM: CT ANGIOGRAPHY CHEST WITH CONTRAST TECHNIQUE: Multidetector CT imaging of the chest was performed using the standard protocol before and during bolus administration of intravenous contrast. Multiplanar CT image reconstructions and MIPs were obtained to evaluate the vascular anatomy. CONTRAST:  45mL OMNIPAQUE IOHEXOL 350 MG/ML SOLN COMPARISON:  Chest radiograph from earlier today. 08/11/2016 chest CT. FINDINGS: Cardiovascular: Top-normal heart size. No significant pericardial effusion/thickening. Three-vessel coronary atherosclerosis. Atherosclerotic nonaneurysmal thoracic aorta. Normal caliber pulmonary arteries. No central pulmonary emboli. No evidence of aortic dissection, intramural hematoma, pseudoaneurysm or penetrating atherosclerotic ulcer. Aortic arch branch vessels are patent. Mixing artifact is noted in the descending thoracic aorta. Mediastinum/Nodes: No discrete thyroid nodules. Unremarkable esophagus. No pathologically enlarged axillary, mediastinal or hilar lymph nodes. Lungs/Pleura: No pneumothorax. No pleural effusion. Severe centrilobular emphysema with diffuse bronchial wall thickening. Partially calcified subpleural bandlike scarring in the posterior upper lobes bilaterally, unchanged. No acute consolidative airspace disease or lung masses. Anterior right upper lobe subpleural 3 mm pulmonary nodule (series 7/image 69), stable, considered benign. No new significant pulmonary nodules. Upper abdomen: Simple exophytic 3.0 cm posterior upper left renal cyst. Musculoskeletal: No aggressive appearing focal osseous lesions. Mild thoracic spondylosis. Review of the MIP images confirms the above findings. IMPRESSION: 1. No acute abnormality. No evidence of thoracic acute aortic syndrome. No central pulmonary embolism. 2. Severe centrilobular emphysema with diffuse bronchial wall thickening, suggesting COPD. No acute pulmonary disease. 3. Three-vessel coronary atherosclerosis. 4.  Aortic Atherosclerosis (ICD10-I70.0) and Emphysema (ICD10-J43.9). Electronically Signed   By: Delbert Phenix M.D.   On: 08/18/2020 13:58    Assessment & Plan    1.  Hypertension-patient's blood pressure has improved.  Should be okay for discharge with close outpatient follow-up.  Would discharge on a regimen of lisinopril/hydrochlorothiazide 20 mg / 25 mg daily, metoprolol succinate 100 mg daily, and amlodipine 5 mg daily.  Low-sodium diet.  We will follow up in our office in 1 week to follow blood pressure and continue to titrate medications as an outpatient.  2.  Coronary artery disease-has ruled out for myocardial infarction with high-sensitivity troponin negative x2.  Would not proceed with any invasive or noninvasive ischemic work-up at present.  Chest CT unremarkable for pulmonary embolus or acute aortic disease.  Chest pain appears to be secondary to her accelerated hypertension.  Would continue with enteric-coated aspirin 81 mg daily.  Chest x-ray showed no acute cardiopulmonary disease. 3.  Tobacco abuse smoking cessation recommended  4.  Reactive airway disease-continue with bronchodilators.  Signed, Darlin Priestly Zebastian Carico MD 08/19/2020, 9:40 AM  Pager: [phone]

## 2020-08-19 NOTE — Progress Notes (Signed)
PHARMACIST - PHYSICIAN COMMUNICATION  CONCERNING:  Enoxaparin (Lovenox) for DVT Prophylaxis    RECOMMENDATION: Patient was prescribed enoxaparin 40mg  q24 hours for VTE prophylaxis.   Filed Weights   08/18/20 1105  Weight: 39.9 kg (88 lb)    Body mass index is 17.19 kg/m.  Estimated Creatinine Clearance: 35.5 mL/min (by C-G formula based on SCr of 0.81 mg/dL).  Patient is candidate for enoxaparin 30mg  every 24 hours based on CrCl <49ml/min or Weight <45kg  DESCRIPTION: Pharmacy has adjusted enoxaparin dose per [facility] policy.  Patient is now receiving enoxaparin 30 mg every 24 hours    31m 08/19/2020 8:22 AM

## 2020-08-24 ENCOUNTER — Telehealth: Payer: Self-pay | Admitting: Infectious Diseases

## 2020-08-24 NOTE — Telephone Encounter (Signed)
Pt called and left VM to schedule a follow up. Attempted to call pt back to schedule. There was no answer and unable to leave a VM.

## 2020-09-09 ENCOUNTER — Other Ambulatory Visit: Payer: Self-pay

## 2020-09-09 ENCOUNTER — Ambulatory Visit: Payer: Medicare PPO | Attending: Infectious Diseases | Admitting: Infectious Diseases

## 2020-09-09 ENCOUNTER — Encounter: Payer: Self-pay | Admitting: Infectious Diseases

## 2020-09-09 VITALS — BP 115/77 | HR 64 | Temp 98.6°F | Resp 17 | Ht 60.0 in | Wt 91.2 lb

## 2020-09-09 DIAGNOSIS — D649 Anemia, unspecified: Secondary | ICD-10-CM | POA: Diagnosis not present

## 2020-09-09 DIAGNOSIS — J449 Chronic obstructive pulmonary disease, unspecified: Secondary | ICD-10-CM | POA: Diagnosis not present

## 2020-09-09 DIAGNOSIS — I2510 Atherosclerotic heart disease of native coronary artery without angina pectoris: Secondary | ICD-10-CM | POA: Insufficient documentation

## 2020-09-09 DIAGNOSIS — Z049 Encounter for examination and observation for unspecified reason: Secondary | ICD-10-CM | POA: Insufficient documentation

## 2020-09-09 DIAGNOSIS — L089 Local infection of the skin and subcutaneous tissue, unspecified: Secondary | ICD-10-CM | POA: Insufficient documentation

## 2020-09-09 DIAGNOSIS — Z87891 Personal history of nicotine dependence: Secondary | ICD-10-CM | POA: Insufficient documentation

## 2020-09-09 DIAGNOSIS — A311 Cutaneous mycobacterial infection: Secondary | ICD-10-CM

## 2020-09-09 DIAGNOSIS — Z79899 Other long term (current) drug therapy: Secondary | ICD-10-CM | POA: Insufficient documentation

## 2020-09-09 DIAGNOSIS — E785 Hyperlipidemia, unspecified: Secondary | ICD-10-CM | POA: Insufficient documentation

## 2020-09-09 DIAGNOSIS — A318 Other mycobacterial infections: Secondary | ICD-10-CM | POA: Diagnosis not present

## 2020-09-09 DIAGNOSIS — J9383 Other pneumothorax: Secondary | ICD-10-CM | POA: Diagnosis not present

## 2020-09-09 DIAGNOSIS — I10 Essential (primary) hypertension: Secondary | ICD-10-CM | POA: Diagnosis not present

## 2020-09-09 DIAGNOSIS — Z955 Presence of coronary angioplasty implant and graft: Secondary | ICD-10-CM | POA: Insufficient documentation

## 2020-09-09 DIAGNOSIS — Z8619 Personal history of other infectious and parasitic diseases: Secondary | ICD-10-CM

## 2020-09-09 DIAGNOSIS — Z09 Encounter for follow-up examination after completed treatment for conditions other than malignant neoplasm: Secondary | ICD-10-CM

## 2020-09-09 NOTE — Progress Notes (Signed)
NAME: Erica Cobb  DOB: 07/03/1941  MRN: [id number]  Date/Time: 09/09/2020 9:15 AM   Subjective:  Follow up  for mycobacterium chelonae skin infection-  Pt was on treatment since 12/02/19 -03/19/20 This is a routine 6 month follow up The lesion on the rt leg has healed completely- just has hyperpigmentation She is doing much better now--more energy , has gained 3 pounds  She was recently in hospital between 10/13-10/14 for HTn urgency Was added amlodipine , and she also takes metoprolol, lisinopril HCTZ, metoprolol, aspirin , atorvastatin, spiriva  Medical history? 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 Pt saw Dr.Dasher ( derm ) in Sept 2020 for various skin lesions due to sun damage.. was diagnosed with actininc keratosis  Over forehead, scalp, cheek and treated with 5 fluoracil topical cream. And also had liquid nitrogen. 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 She then underwent excision of the melanoma in situ on 08/29/19- pathology  came back as clear margins. The surgical site on 09/12/19 had some slime and culture sent and she was started on Doxycycline. It grew pseudomonas and MSSA. During her visit on 10/17/19 violaceous nodules were seen and a punch biopsy taken for HPE . 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 On 10/23/19 another punch biopsy was sent for AFB culture and she was placed on minocycline and ciprofloxacin but patient was reghtly asked not to take it and wait for culture report after dermatologist discussed with 2 ID physicians ( including me) The culture resulted as mycobacterium chelonae on 11/19/19 and I was asked to see the patient. I saw her on 1/21 and on 1/26 we started  treatment with azithromycin and minocycline- In her follow up visit on 2/25 she said that she had stopped the meds for a few days because of severe GI symptoms and also dizziness and headche. It was likely minocycline caused the the sideeffect and I asked her to continue azithromycin alone as it was a localized SSI. On 02/05/20,  I saw her and the lesion was getting worse hence  linezolid 600mg  once a day  was added to azithromycin 500mg  once a day on 02/06/20 along with pyridoxine 50mg  after explaining to her the side effetcs of linezolid and food to avoid . - she was some what tolerating both azithromycin and linezolid for 3 weeks and then started having GI symptoms, with diarrhea , nausea and stopped the medication on 02/28/20. She saw me on 03/02/20 and I sent labs and cdiff- stool cdiff was negative CBC on 03/02/20 showed WBC of 4.5, plt of 120 and Hb 11.7. AST was 61 and ST 78. Lactate was normal at 1.4.  asked her to hold atorvatstatin and restarted azithromycin at 250mg  and linezolid at the same dose of 600mg  , both once a day on 03/03/20. Stopped both meds on 03/19/20    PMH CAD (coronary artery disease) 04/09/2005  s/p Cypher stent to LAD and RCA 6/06  . Heart attack (CMS-HCC)  . HLD (hyperlipidemia)  . HTN (hypertension)  . Other emphysema (CMS-HCC) 08/27/2016     Past Surgical History:  Procedure Laterality Date  . CHEST TUBE INSERTION     for  spontaneous pneumo  . stents  2006   LAD  Dr. Alben Spittle    Social History   Socioeconomic History  . Marital status: Married    Spouse name: Not on file  . Number of children: Not on file  . Years of education: Not on file  . Highest education level: Not on file  Occupational History  . Not on file  Tobacco Use  . Smoking status: Former Smoker    Packs/day: 0.50    Years: 15.00    Pack years: 7.50    Types: Cigarettes    Quit date: 2006    Years since quitting: 15.8  . Smokeless tobacco: Never Used  Vaping Use  . Vaping Use: Never  used  Substance and Sexual Activity  . Alcohol use: Yes    Alcohol/week: 4.0 standard drinks    Types: 4 Cans of beer per week    Comment: socially  . Drug use: No  . Sexual activity: Not on file  Other Topics Concern  . Not on file  Social History Narrative  . Not on file   Social Determinants of Health   Financial Resource Strain:   . Difficulty of Paying Living Expenses: Not on file  Food Insecurity:   . Worried About Programme researcher, broadcasting/film/video in the Last Year: Not on file  . Ran Out of Food in the Last Year: Not on file  Transportation Needs:   . Lack of Transportation (Medical): Not on file  . Lack of Transportation (Non-Medical): Not on file  Physical Activity:   . Days of Exercise per Week: Not on file  . Minutes of Exercise per Session: Not on file  Stress:   . Feeling of Stress : Not on file  Social Connections:   . Frequency of Communication with Friends and Family: Not on file  . Frequency of Social Gatherings with Friends and Family: Not on file  . Attends Religious Services: Not on file  . Active Member of Clubs or Organizations: Not on file  . Attends Banker Meetings: Not on file  . Marital Status: Not on file  Intimate Partner Violence:   . Fear of Current or Ex-Partner: Not on file  . Emotionally Abused: Not on file  . Physically Abused: Not on file  . Sexually Abused: Not on file    Family History  Problem Relation Age of Onset  . Cancer Mother        breast  . Heart disease Father   . Heart disease Brother   . Heart disease Paternal Aunt    No Known Allergies  ? Current Outpatient Medications  Medication Sig Dispense Refill  . albuterol (VENTOLIN HFA) 108 (90 Base) MCG/ACT inhaler Inhale 2 puffs into the lungs every 4 (four) hours as needed for wheezing or shortness of breath. (Patient not taking: Reported on 08/18/2020) 1 g 6  . amLODipine (NORVASC) 5 MG tablet Take 1 tablet (5 mg total) by mouth daily. 30 tablet 2  . aspirin EC 81 MG  tablet Take 81 mg by mouth daily.     Marland Kitchen atorvastatin (LIPITOR) 40 MG tablet Take 40 mg by mouth daily.    Marland Kitchen lisinopril-hydrochlorothiazide (ZESTORETIC) 20-12.5 MG tablet TAKE 1 TABLET EVERY DAY    . metoprolol succinate (TOPROL-XL) 100 MG 24 hr tablet Take 100 mg by mouth daily.     . nitroGLYCERIN (NITROSTAT) 0.4 MG SL tablet DISSOLVE 1 TABLET UNDER THE TONGUE EVERY 5 MINUTES AS NEEDED FOR CHEST  PAIN, MAY TAKE UP TO 3 DOSES    . SPIRIVA RESPIMAT 1.25 MCG/ACT AERS Inhale 2 puffs into the lungs daily.     No current facility-administered medications for this visit.     Abtx:  Anti-infectives (From admission, onward)   None      REVIEW OF SYSTEMS:  Const: negative fever, negative chills, weight gain of 3 pounds  Eyes: negative diplopia or visual changes, negative eye pain ENT: negative coryza, negative sore throat Resp: negative cough,has dyspnea Cards: negative for chest pain, palpitations, lower extremity edema GU: negative for frequency, dysuria and hematuria GI: as above  skin:  No new lesions Heme: negative for easy bruising and gum/nose bleeding MS: fatigue and weakness much improved[place] Neurolo:No headaches, no dizziness, No vertigo, memory problems  Psych: negative for feelings of anxiety, depression  Endocrine: negative for thyroid, diabetes Allergy/Immunology- negative for any medication or food allergies ?  Objective:  VITALS:  bp 115/77, wt 91pounds, temp 98.6. HR 64  PHYSICAL EXAM:  General: looks well Head: Normocephalic, without obvious abnormality, atraumatic. Lungs: Clear to auscultation bilaterally. No Wheezing or Rhonchi. No rales. Heart: Regular rate and rhythm, no murmur, rub or gallop. Extremities: rt calf- hyperpigmentation over the scar-   09/09/20     04/27/20    5/18     4/27      02/05/20    01/01/20    11/27/19  Skin: severly sun damaged skin Lymph: Cervical, supraclavicular  normal. Neurologic: Grossly non-focal    Microbiology:      Labs CBC Latest Ref Rng & Units 08/19/2020 08/18/2020 04/01/2020  WBC 4.0 - 10.5 K/uL 3.9(L) 5.0 4.2  Hemoglobin 12.0 - 15.0 g/dL 16.112.7 09.614.0 10.2(L)  Hematocrit 36 - 46 % 37.3 41.8 31.8(L)  Platelets 150 - 400 K/uL 186 184 252   CMP Latest Ref Rng & Units 08/19/2020 08/18/2020 04/01/2020  Glucose 70 - 99 mg/dL 87 92 045(W121(H)  BUN 8 - 23 mg/dL 19 18 22   Creatinine 0.44 - 1.00 mg/dL 0.980.81 1.190.82 1.47(W1.04(H)  Sodium 135 - 145 mmol/L 138 137 142  Potassium 3.5 - 5.1 mmol/L 3.7 4.0 4.0  Chloride 98 - 111 mmol/L 99 97(L) 103  CO2 22 - 32 mmol/L 29 28 29   Calcium 8.9 - 10.3 mg/dL 9.2 9.8 9.6  Total Protein 6.5 - 8.1 g/dL - - 6.6  Total Bilirubin 0.3 - 1.2 mg/dL - - 0.7  Alkaline Phos 38 - 126 U/L - - 65  AST 15 - 41 U/L - - 39  ALT 0 - 44 U/L - - 37          ? Impression/Recommendation ? 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 Then she was on azithromycin alone for a month  linezolid and azithromycin since 4/1 - she stopped it 02/28/20 and re started on 4/28 and completed 5/14 Infection healed and now has hyperpigmentaiton only No more antibiotics She need not see me on a routine basis    Anemia : resolved- last Hb on 08/19/20 was 12.7  CAD with stents current meds -aspirin, metoprolol, zestoretic,  Malignant HTN- was hospitalized for 24 hrs in October-  amlodipine added and she is doing fine   Atorvastatin for hyperlipidemia   HTN- on lisinopril/hctz and metoprolol ? ?Ex smoker   Spontaneous Pneumothorax- COPD phenotype-followed by Dr.KAsa Discharged from my clinic

## 2020-09-09 NOTE — Patient Instructions (Signed)
You  are here  for follow up of mycobacterium chelonae of the skin.you completed the antibiotic and has healed well. So you dont need to see me any more on a routine basis

## 2020-12-07 ENCOUNTER — Telehealth: Payer: Self-pay | Admitting: Internal Medicine

## 2020-12-07 NOTE — Telephone Encounter (Signed)
Lm for patient.  

## 2020-12-07 NOTE — Telephone Encounter (Signed)
Patient os returning phone call. Patient phone number is [phone].

## 2020-12-08 NOTE — Telephone Encounter (Signed)
Lm for patient.  

## 2020-12-08 NOTE — Telephone Encounter (Signed)
Called and spoke to patient, who is questioning if she can fly with her history of pneumothorax in 2017.   Dr. Belia Heman, please advise. Thanks

## 2020-12-08 NOTE — Telephone Encounter (Signed)
Yes but needs to be careful and monitor for respiratory symptoms. There is a slight increased risk

## 2020-12-09 NOTE — Telephone Encounter (Signed)
Patient is aware of below recommendations.  She voiced her understanding and had no further questions.  Nothing further needed.  

## 2021-04-23 IMAGING — CR DG CHEST 2V
2 series · 2 of 2 positions shown · non-contrast
Comparison: 08/25/2016

CLINICAL DATA: Routine follow-up for COPD.

EXAM:
CHEST - 2 VIEW

[chest pa]
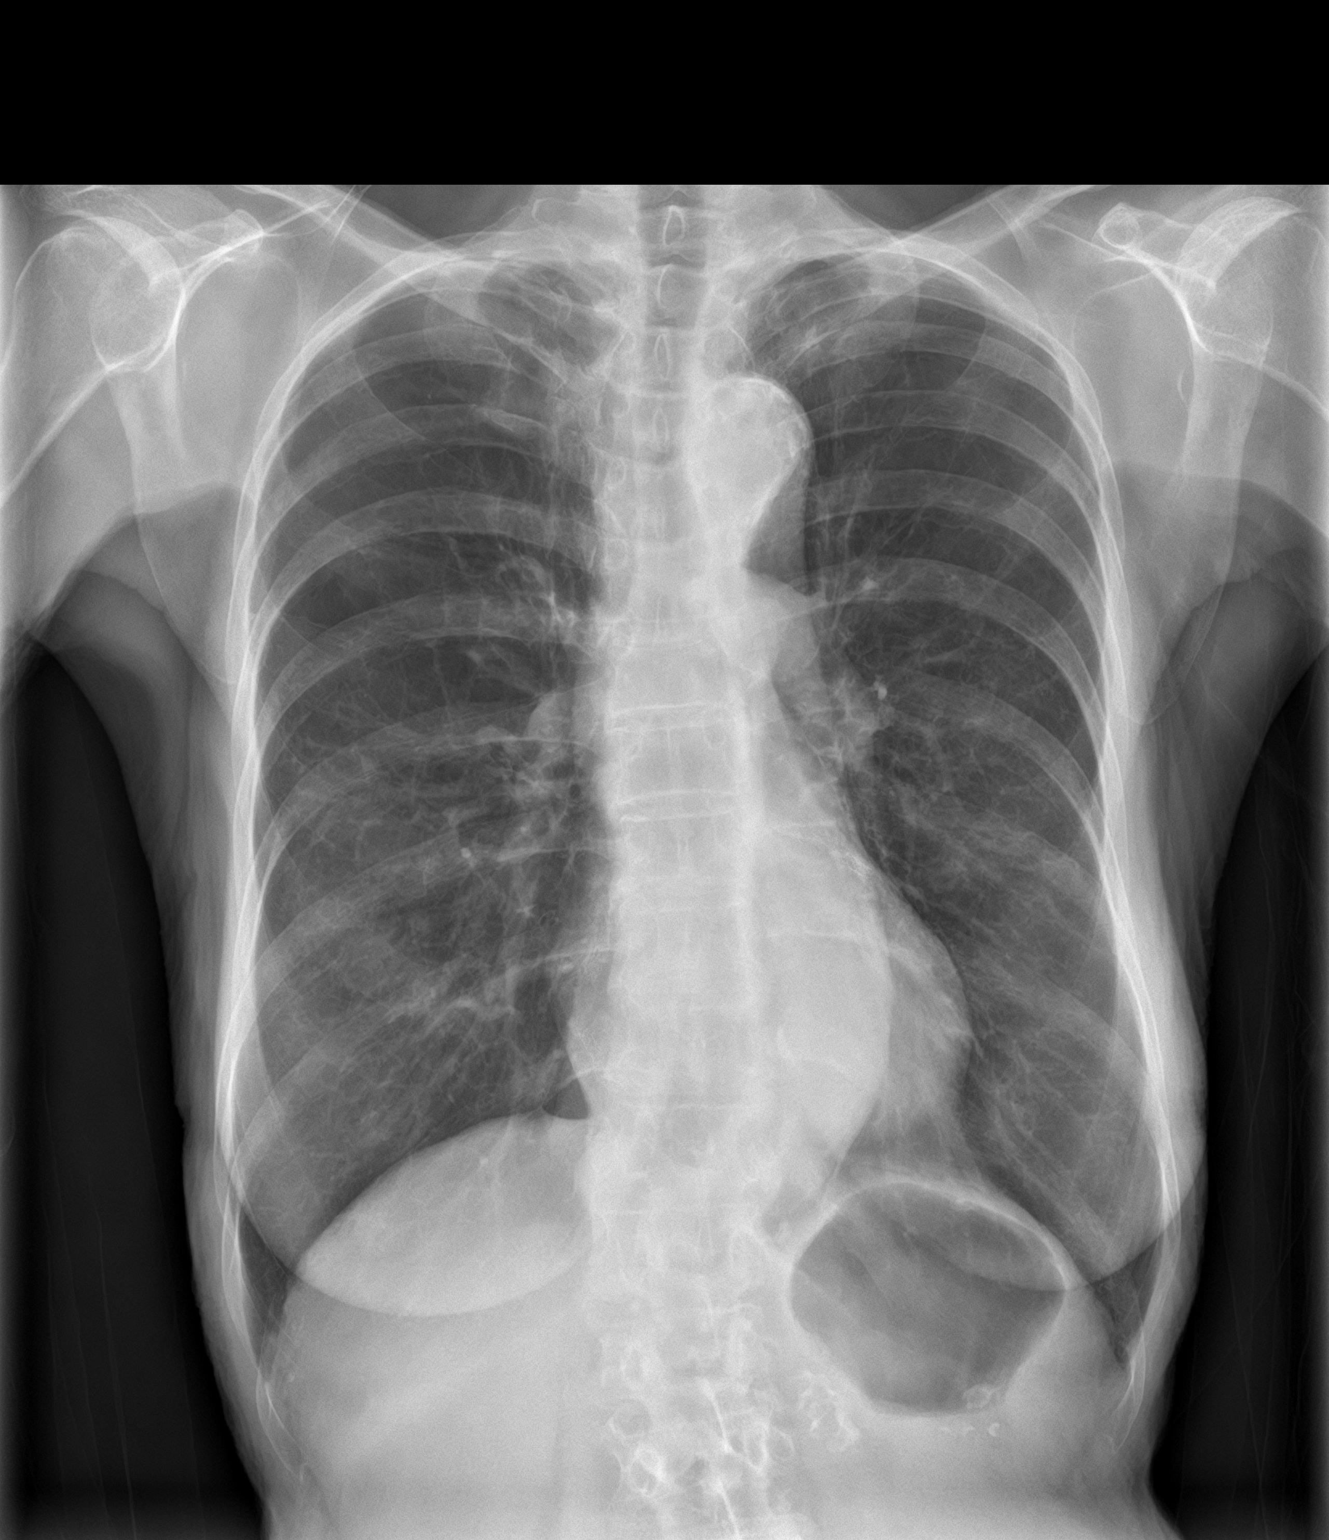

[chest lat]
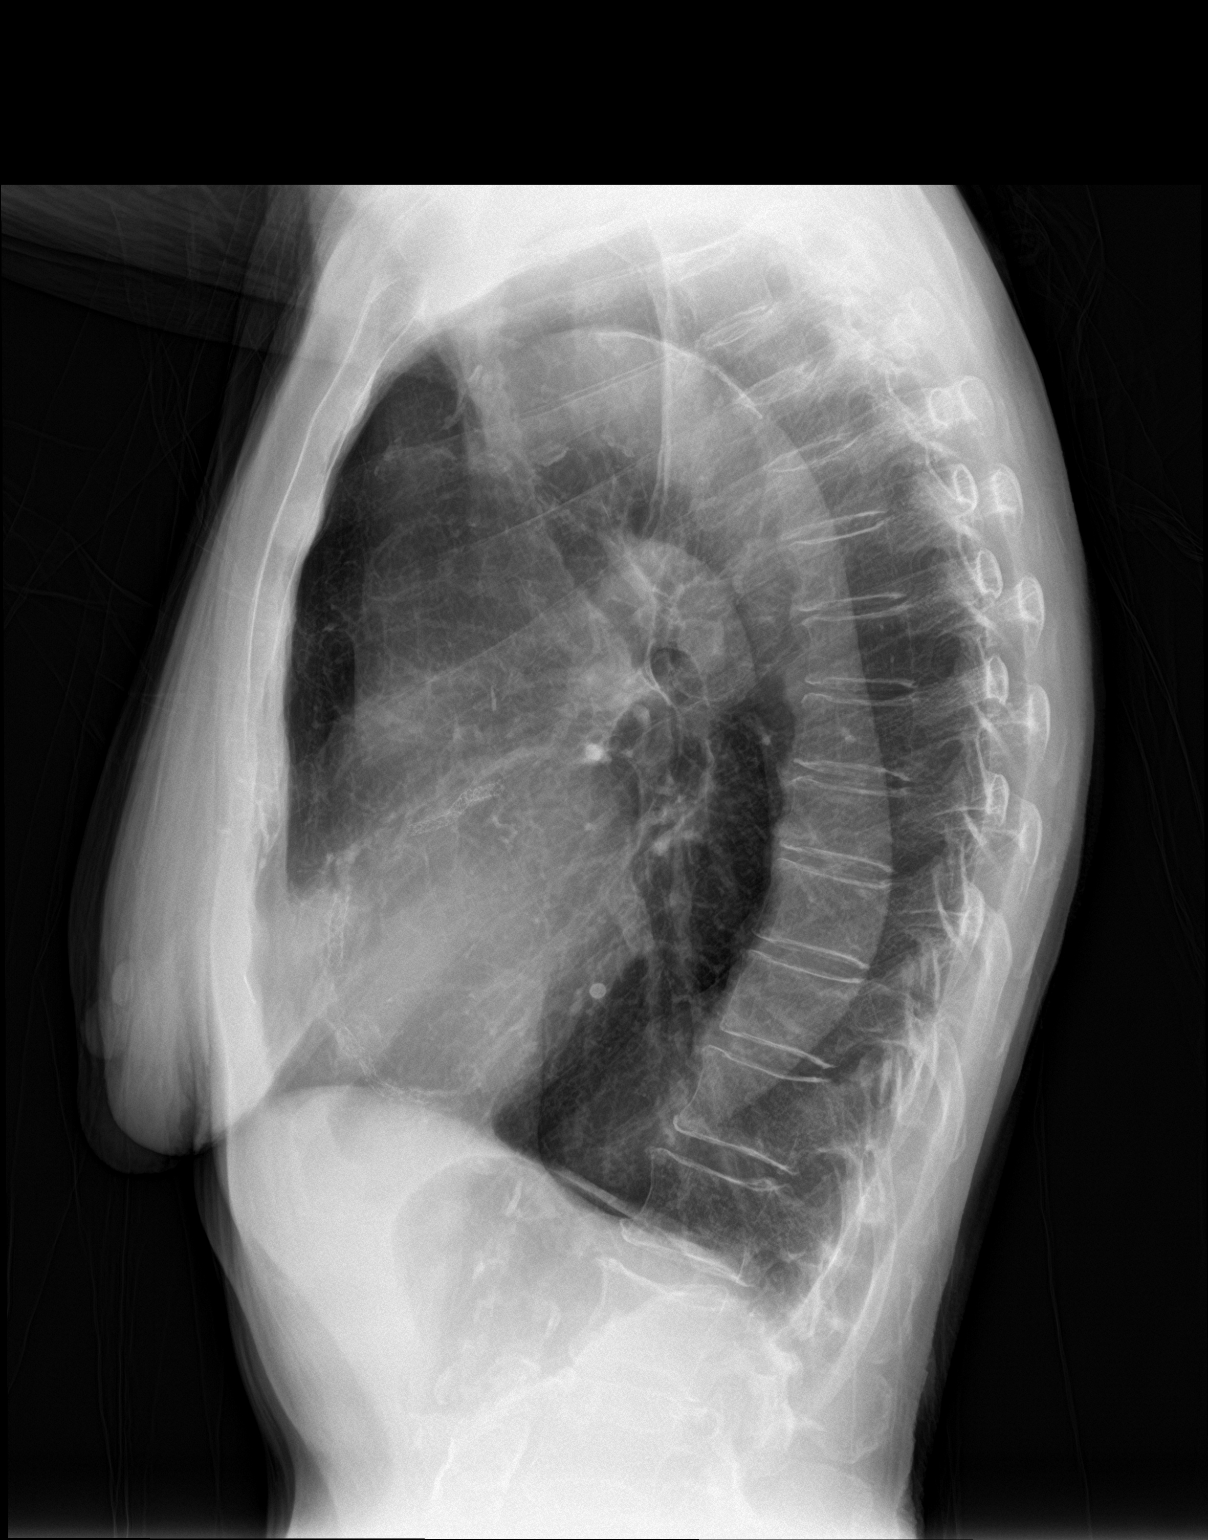

[2 of 2 positions shown; findings below may reference images not displayed]

FINDINGS: Normal heart size. Aortic atherosclerosis. There is no pleural
effusion or edema. Lungs are hyperinflated and there are coarsened
interstitial markings noted throughout both lungs. No superimposed
airspace consolidation identified. The visualized osseous structures
are unremarkable.
IMPRESSION: 1. No acute cardiopulmonary abnormalities.
2. COPD.

## 2023-10-28 ENCOUNTER — Other Ambulatory Visit: Payer: Self-pay

## 2023-10-28 ENCOUNTER — Encounter: Payer: Self-pay | Admitting: Emergency Medicine

## 2023-10-28 ENCOUNTER — Emergency Department
Admission: EM | Admit: 2023-10-28 | Discharge: 2023-10-28 | Disposition: A | Discharge status: Home / Self Care | Payer: Medicare PPO | Attending: Emergency Medicine | Admitting: Emergency Medicine

## 2023-10-28 ENCOUNTER — Emergency Department: Payer: Medicare PPO

## 2023-10-28 DIAGNOSIS — I2510 Atherosclerotic heart disease of native coronary artery without angina pectoris: Secondary | ICD-10-CM | POA: Diagnosis not present

## 2023-10-28 DIAGNOSIS — M25522 Pain in left elbow: Secondary | ICD-10-CM | POA: Diagnosis present

## 2023-10-28 DIAGNOSIS — I10 Essential (primary) hypertension: Secondary | ICD-10-CM | POA: Diagnosis not present

## 2023-10-28 DIAGNOSIS — J449 Chronic obstructive pulmonary disease, unspecified: Secondary | ICD-10-CM | POA: Insufficient documentation

## 2023-10-28 LAB — BASIC METABOLIC PANEL
Anion gap: 13 (ref 5–15)
BUN: 30 mg/dL — ABNORMAL HIGH (ref 8–23)
CO2: 25 mmol/L (ref 22–32)
Calcium: 9.5 mg/dL (ref 8.9–10.3)
Chloride: 100 mmol/L (ref 98–111)
Creatinine, Ser: 1 mg/dL (ref 0.44–1.00)
GFR, Estimated: 56 mL/min — ABNORMAL LOW (ref >60)
Glucose, Bld: 127 mg/dL — ABNORMAL HIGH (ref 70–99)
Potassium: 4.2 mmol/L (ref 3.5–5.1)
Sodium: 138 mmol/L (ref 135–145)

## 2023-10-28 LAB — CBC WITH DIFFERENTIAL/PLATELET
Abs Immature Granulocytes: 0.04 10*3/uL (ref 0.00–0.07)
Basophils Absolute: 0 10*3/uL (ref 0.0–0.1)
Basophils Relative: 0 %
Eosinophils Absolute: 0.1 10*3/uL (ref 0.0–0.5)
Eosinophils Relative: 1 %
HCT: 44.3 % (ref 36.0–46.0)
Hemoglobin: 14.3 g/dL (ref 12.0–15.0)
Immature Granulocytes: 0 %
Lymphocytes Relative: 15 %
Lymphs Abs: 1.5 10*3/uL (ref 0.7–4.0)
MCH: 32 pg (ref 26.0–34.0)
MCHC: 32.3 g/dL (ref 30.0–36.0)
MCV: 99.1 fL (ref 80.0–100.0)
Monocytes Absolute: 0.9 10*3/uL (ref 0.1–1.0)
Monocytes Relative: 9 %
Neutro Abs: 7.4 10*3/uL (ref 1.7–7.7)
Neutrophils Relative %: 75 %
Platelets: 201 10*3/uL (ref 150–400)
RBC: 4.47 MIL/uL (ref 3.87–5.11)
RDW: 12.6 % (ref 11.5–15.5)
WBC: 9.9 10*3/uL (ref 4.0–10.5)
nRBC: 0 % (ref 0.0–0.2)

## 2023-10-28 MED ORDER — OXYCODONE-ACETAMINOPHEN 5-325 MG PO TABS
1.0000 | ORAL_TABLET | Freq: Once | ORAL | Status: AC
  Administered 2023-10-28: 1 via ORAL
  Filled 2023-10-28: qty 1

## 2023-10-28 NOTE — ED Triage Notes (Signed)
Patient to ED via POV for left elbow pain. Denies injury but hurts to move. Also having hypertension- 177/107. Hx of HTN- has not missed doses. Denies dizziness, blurred vision or headache.

## 2023-10-28 NOTE — ED Provider Notes (Signed)
[facility] Provider Note    Event Date/Time   First MD Initiated Contact with Patient 10/28/23 1832     (approximate)   History   Chief Complaint Arm Pain   HPI  Erica Cobb is a 82 y.o. female with past medical history of hypertension, hyperlipidemia, CAD, and COPD who presents to the ED complaining of arm pain.  Patient ports that she woke up this morning with pain around her left elbow, describes significant discomfort when bending her arm at the elbow.  She has not had any recent trauma to this area, has not noticed any redness or swelling.  She does state that her blood pressure has been up when she checks it so far today, denies any missed doses of her medication.  She has not had any fevers, cough, chest pain, or shortness of breath.     Physical Exam   Triage Vital Signs: ED Triage Vitals  Encounter Vitals Group     BP 10/28/23 1811 (!) 199/108     Systolic BP Percentile --      Diastolic BP Percentile --      Pulse Rate 10/28/23 1809 80     Resp 10/28/23 1809 18     Temp 10/28/23 1809 97.9 F (36.6 C)     Temp Source 10/28/23 1809 Oral     SpO2 10/28/23 1809 94 %     Weight 10/28/23 1810 89 lb (40.4 kg)     Height 10/28/23 1810 5' (1.524 m)     Head Circumference --      Peak Flow --      Pain Score 10/28/23 1810 5     Pain Loc --      Pain Education --      Exclude from Growth Chart --     Most recent vital signs: Vitals:   10/28/23 1811 10/28/23 1900  BP: (!) 199/108 (!) 164/78  Pulse:  77  Resp:  14  Temp:    SpO2:  95%    Constitutional: Alert and oriented. Eyes: Conjunctivae are normal. Head: Atraumatic. Nose: No congestion/rhinnorhea. Mouth/Throat: Mucous membranes are moist.  Cardiovascular: Normal rate, regular rhythm. Grossly normal heart sounds.  2+ radial pulses bilaterally. Respiratory: Normal respiratory effort.  No retractions. Lungs CTAB. Gastrointestinal: Soft and nontender. No  distention. Musculoskeletal: No lower extremity tenderness nor edema.  Tenderness to palpation diffusely at left elbow with pain upon range of motion although range of motion intact.  Mild warmth noted with no erythema or edema.  No tenderness to palpation around left wrist or shoulder. Neurologic:  Normal speech and language. No gross focal neurologic deficits are appreciated.    ED Results / Procedures / Treatments   Labs (all labs ordered are listed, but only abnormal results are displayed) Labs Reviewed  BASIC METABOLIC PANEL - Abnormal; Notable for the following components:      Result Value   Glucose, Bld 127 (*)    BUN 30 (*)    GFR, Estimated 56 (*)    All other components within normal limits  CBC WITH DIFFERENTIAL/PLATELET     EKG  ED ECG REPORT I, Chesley Noon, the attending physician, personally viewed and interpreted this ECG.   Date: 10/28/2023  EKG Time: 18:54  Rate: 80  Rhythm: normal sinus rhythm  Axis: Normal  Intervals:none  ST&T Change: None  RADIOLOGY Left elbow x-ray reviewed and interpreted by me with no fracture or dislocation.  PROCEDURES:  Critical Care performed: No  Procedures   MEDICATIONS ORDERED IN ED: Medications  oxyCODONE-acetaminophen (PERCOCET/ROXICET) 5-325 MG per tablet 1 tablet (1 tablet Oral Given 10/28/23 1847)     IMPRESSION / MDM / ASSESSMENT AND PLAN / ED COURSE  I reviewed the triage vital signs and the nursing notes.                              82 y.o. female with past medical history of hypertension, hyperlipidemia, CAD, and COPD who presents to the ED complaining of pain around her left elbow since waking up this morning.  Patient's presentation is most consistent with acute presentation with potential threat to life or bodily function.  Differential diagnosis includes, but is not limited to, septic arthritis, inflammatory arthritis, osteoarthritis, fracture, dislocation, ACS, anemia, electrolyte  abnormality, AKI.  Patient nontoxic-appearing and in no acute distress, vital signs remarkable for hypertension but otherwise reassuring.  She primarily complains of pain around her left elbow, no findings concerning for septic arthritis as she is able to range the elbow and there is no erythema or edema.  Mild warmth noted and would consider inflammatory versus osteoarthritis.  X-ray results pending at this time, labs show no significant anemia, leukocytosis, electrolyte abnormality, or AKI.  Doubt ACS given pain reproducible upon range of motion, EKG pending.  We will treat symptomatically with Percocet and reassess.  Left elbow x-ray is unremarkable, patient's pain improved on reassessment and she is able to range the elbow with minimal difficulty.  EKG shows no evidence of arrhythmia or ischemia, no evidence of hypertensive emergency at this time and patient's blood pressure improved on recheck.  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  She was counseled to return to the ED for new or worsening symptoms.  Patient and family agree with plan.      FINAL CLINICAL IMPRESSION(S) / ED DIAGNOSES   Final diagnoses:  Left elbow pain  Hypertension, unspecified type     Rx / DC Orders   ED Discharge Orders     None        Note:  This document was prepared using Dragon voice recognition software and may include unintentional dictation errors.   Chesley Noon, MD 10/28/23 [phone]
